# Patient Record
Sex: Female | Born: 1975 | Race: White | Hispanic: No | Marital: Married | Smoking: Former smoker
Health system: Southern US, Community
[De-identification: ages and names within clinical notes are randomized; demographics above are authoritative.]

## PROBLEM LIST (undated history)

## (undated) DIAGNOSIS — Z87891 Personal history of nicotine dependence: Secondary | ICD-10-CM

## (undated) DIAGNOSIS — Z803 Family history of malignant neoplasm of breast: Secondary | ICD-10-CM

## (undated) DIAGNOSIS — D649 Anemia, unspecified: Secondary | ICD-10-CM

## (undated) DIAGNOSIS — F329 Major depressive disorder, single episode, unspecified: Secondary | ICD-10-CM

## (undated) DIAGNOSIS — R519 Headache, unspecified: Secondary | ICD-10-CM

## (undated) DIAGNOSIS — R51 Headache: Secondary | ICD-10-CM

## (undated) DIAGNOSIS — G8929 Other chronic pain: Secondary | ICD-10-CM

## (undated) DIAGNOSIS — F32A Depression, unspecified: Secondary | ICD-10-CM

## (undated) DIAGNOSIS — F419 Anxiety disorder, unspecified: Secondary | ICD-10-CM

## (undated) HISTORY — DX: Major depressive disorder, single episode, unspecified: F32.9

## (undated) HISTORY — DX: Headache, unspecified: R51.9

## (undated) HISTORY — DX: Anemia, unspecified: D64.9

## (undated) HISTORY — DX: Headache: R51

## (undated) HISTORY — PX: TUBAL LIGATION: SHX77

## (undated) HISTORY — DX: Depression, unspecified: F32.A

## (undated) HISTORY — DX: Other chronic pain: G89.29

## (undated) HISTORY — DX: Family history of malignant neoplasm of breast: Z80.3

## (undated) HISTORY — DX: Anxiety disorder, unspecified: F41.9

## (undated) HISTORY — DX: Personal history of nicotine dependence: Z87.891

## (undated) HISTORY — PX: ABDOMINAL HYSTERECTOMY: SHX81

---

## 2004-01-30 HISTORY — PX: VAGINAL HYSTERECTOMY: SHX2639

## 2004-11-30 ENCOUNTER — Observation Stay (HOSPITAL_COMMUNITY): Admission: RE | Admit: 2004-11-30 | Discharge: 2004-12-01 | Payer: Self-pay | Admitting: Gynecology

## 2006-01-29 HISTORY — PX: BREAST BIOPSY: SHX20

## 2007-03-14 ENCOUNTER — Ambulatory Visit: Payer: Self-pay | Admitting: Family Medicine

## 2007-08-26 ENCOUNTER — Ambulatory Visit: Payer: Self-pay | Admitting: Family Medicine

## 2007-08-28 ENCOUNTER — Ambulatory Visit: Payer: Self-pay | Admitting: Family Medicine

## 2008-01-30 HISTORY — PX: BREAST EXCISIONAL BIOPSY: SUR124

## 2008-12-08 ENCOUNTER — Ambulatory Visit: Payer: Self-pay | Admitting: Family Medicine

## 2009-06-06 ENCOUNTER — Ambulatory Visit: Payer: Self-pay | Admitting: Family Medicine

## 2010-01-29 HISTORY — PX: GALLBLADDER SURGERY: SHX652

## 2010-01-29 HISTORY — PX: GASTRIC BYPASS: SHX52

## 2010-02-27 ENCOUNTER — Ambulatory Visit: Payer: Self-pay | Admitting: Family Medicine

## 2010-05-24 ENCOUNTER — Ambulatory Visit: Payer: Self-pay | Admitting: Specialist

## 2010-05-29 ENCOUNTER — Ambulatory Visit: Payer: Self-pay | Admitting: Specialist

## 2010-05-31 ENCOUNTER — Ambulatory Visit: Payer: Self-pay | Admitting: Specialist

## 2010-06-13 ENCOUNTER — Ambulatory Visit: Payer: Self-pay | Admitting: Specialist

## 2010-06-22 ENCOUNTER — Ambulatory Visit: Payer: Self-pay | Admitting: Gastroenterology

## 2010-06-30 ENCOUNTER — Ambulatory Visit: Payer: Self-pay | Admitting: Specialist

## 2010-07-06 ENCOUNTER — Ambulatory Visit: Payer: Self-pay | Admitting: Specialist

## 2010-07-11 ENCOUNTER — Inpatient Hospital Stay: Payer: Self-pay | Admitting: Specialist

## 2010-08-23 ENCOUNTER — Ambulatory Visit: Payer: Self-pay | Admitting: Specialist

## 2010-09-13 ENCOUNTER — Ambulatory Visit: Payer: Self-pay | Admitting: Specialist

## 2010-09-30 ENCOUNTER — Ambulatory Visit: Payer: Self-pay | Admitting: Specialist

## 2011-07-04 ENCOUNTER — Inpatient Hospital Stay: Payer: Self-pay | Admitting: Internal Medicine

## 2011-07-04 LAB — URINALYSIS, COMPLETE
Blood: NEGATIVE
Glucose,UR: NEGATIVE mg/dL (ref 0–75)
Ketone: NEGATIVE
Nitrite: NEGATIVE
Ph: 5 (ref 4.5–8.0)
Protein: NEGATIVE
Squamous Epithelial: 15
WBC UR: 3 /HPF (ref 0–5)

## 2011-07-04 LAB — COMPREHENSIVE METABOLIC PANEL
Albumin: 3.6 g/dL (ref 3.4–5.0)
Alkaline Phosphatase: 72 U/L (ref 50–136)
Anion Gap: 6 — ABNORMAL LOW (ref 7–16)
BUN: 8 mg/dL (ref 7–18)
Bilirubin,Total: 0.7 mg/dL (ref 0.2–1.0)
Calcium, Total: 8.8 mg/dL (ref 8.5–10.1)
Co2: 28 mmol/L (ref 21–32)
EGFR (African American): >60
EGFR (Non-African Amer.): >60
Glucose: 91 mg/dL (ref 65–99)
SGOT(AST): 13 U/L — ABNORMAL LOW (ref 15–37)
SGPT (ALT): 16 U/L
Total Protein: 6.7 g/dL (ref 6.4–8.2)

## 2011-07-04 LAB — CBC
HGB: 15.9 g/dL (ref 12.0–16.0)
MCH: 30.5 pg (ref 26.0–34.0)
MCV: 90 fL (ref 80–100)
RBC: 5.21 10*6/uL — ABNORMAL HIGH (ref 3.80–5.20)
RDW: 13 % (ref 11.5–14.5)

## 2011-07-05 LAB — CBC WITH DIFFERENTIAL/PLATELET
Basophil %: 0.7 %
Eosinophil #: 0.2 10*3/uL (ref 0.0–0.7)
Eosinophil %: 2.6 %
Lymphocyte #: 3.7 10*3/uL — ABNORMAL HIGH (ref 1.0–3.6)
Lymphocyte %: 52.6 %
MCH: 30.1 pg (ref 26.0–34.0)
MCHC: 33.2 g/dL (ref 32.0–36.0)
MCV: 91 fL (ref 80–100)
Neutrophil %: 38.4 %
Platelet: 137 10*3/uL — ABNORMAL LOW (ref 150–440)
RBC: 4.7 10*6/uL (ref 3.80–5.20)
RDW: 13.4 % (ref 11.5–14.5)
WBC: 7 10*3/uL (ref 3.6–11.0)

## 2011-07-05 LAB — BASIC METABOLIC PANEL
Anion Gap: 7 (ref 7–16)
Calcium, Total: 8.3 mg/dL — ABNORMAL LOW (ref 8.5–10.1)
Creatinine: 0.58 mg/dL — ABNORMAL LOW (ref 0.60–1.30)
EGFR (African American): >60
EGFR (Non-African Amer.): >60
Sodium: 142 mmol/L (ref 136–145)

## 2011-07-06 LAB — KOH PREP

## 2011-07-25 ENCOUNTER — Emergency Department: Payer: Self-pay | Admitting: *Deleted

## 2011-07-25 LAB — URINALYSIS, COMPLETE
Bilirubin,UR: NEGATIVE
Glucose,UR: NEGATIVE mg/dL (ref 0–75)
Nitrite: NEGATIVE
RBC,UR: 1 /HPF (ref 0–5)
Specific Gravity: 1.026 (ref 1.003–1.030)
Squamous Epithelial: 8

## 2011-07-25 LAB — LIPASE, BLOOD: Lipase: 113 U/L (ref 73–393)

## 2011-07-25 LAB — COMPREHENSIVE METABOLIC PANEL
Albumin: 3.8 g/dL (ref 3.4–5.0)
Calcium, Total: 9.1 mg/dL (ref 8.5–10.1)
Co2: 29 mmol/L (ref 21–32)
Creatinine: 0.87 mg/dL (ref 0.60–1.30)
EGFR (Non-African Amer.): >60
Glucose: 109 mg/dL — ABNORMAL HIGH (ref 65–99)
SGOT(AST): 17 U/L (ref 15–37)
SGPT (ALT): 16 U/L

## 2011-07-25 LAB — CBC
HCT: 47.8 % — ABNORMAL HIGH (ref 35.0–47.0)
HGB: 16 g/dL (ref 12.0–16.0)
MCH: 29.9 pg (ref 26.0–34.0)
MCV: 89 fL (ref 80–100)
Platelet: 185 10*3/uL (ref 150–440)
WBC: 6.4 10*3/uL (ref 3.6–11.0)

## 2012-04-01 ENCOUNTER — Emergency Department: Payer: Self-pay | Admitting: Emergency Medicine

## 2012-04-01 LAB — COMPREHENSIVE METABOLIC PANEL
Albumin: 3.4 g/dL (ref 3.4–5.0)
Alkaline Phosphatase: 72 U/L (ref 50–136)
Anion Gap: 4 — ABNORMAL LOW (ref 7–16)
BUN: 8 mg/dL (ref 7–18)
Bilirubin,Total: 0.4 mg/dL (ref 0.2–1.0)
Chloride: 108 mmol/L — ABNORMAL HIGH (ref 98–107)
Creatinine: 0.64 mg/dL (ref 0.60–1.30)
EGFR (African American): >60
EGFR (Non-African Amer.): >60
Potassium: 4.1 mmol/L (ref 3.5–5.1)
SGOT(AST): 19 U/L (ref 15–37)
SGPT (ALT): 23 U/L (ref 12–78)
Sodium: 139 mmol/L (ref 136–145)
Total Protein: 6.2 g/dL — ABNORMAL LOW (ref 6.4–8.2)

## 2012-04-01 LAB — URINALYSIS, COMPLETE
Bilirubin,UR: NEGATIVE
Blood: NEGATIVE
Leukocyte Esterase: NEGATIVE
Nitrite: NEGATIVE
Ph: 5 (ref 4.5–8.0)
Specific Gravity: 1.021 (ref 1.003–1.030)
Squamous Epithelial: 4

## 2012-04-01 LAB — CBC
HGB: 14.2 g/dL (ref 12.0–16.0)
MCH: 31.1 pg (ref 26.0–34.0)
MCHC: 34.1 g/dL (ref 32.0–36.0)
Platelet: 164 10*3/uL (ref 150–440)
WBC: 7.8 10*3/uL (ref 3.6–11.0)

## 2012-07-23 ENCOUNTER — Emergency Department: Payer: Self-pay

## 2012-07-23 LAB — URINALYSIS, COMPLETE
Bilirubin,UR: NEGATIVE
Blood: NEGATIVE
Nitrite: NEGATIVE
Ph: 5 (ref 4.5–8.0)
Protein: NEGATIVE
Specific Gravity: 1.025 (ref 1.003–1.030)
Squamous Epithelial: 3

## 2012-07-23 LAB — COMPREHENSIVE METABOLIC PANEL
Albumin: 3.6 g/dL (ref 3.4–5.0)
Alkaline Phosphatase: 96 U/L (ref 50–136)
Chloride: 111 mmol/L — ABNORMAL HIGH (ref 98–107)
Co2: 28 mmol/L (ref 21–32)
Creatinine: 0.7 mg/dL (ref 0.60–1.30)
EGFR (African American): >60
EGFR (Non-African Amer.): >60
Osmolality: 283 (ref 275–301)
Sodium: 142 mmol/L (ref 136–145)
Total Protein: 6.6 g/dL (ref 6.4–8.2)

## 2012-07-23 LAB — CBC
HCT: 41.5 % (ref 35.0–47.0)
MCV: 89 fL (ref 80–100)
RDW: 12.9 % (ref 11.5–14.5)
WBC: 7.5 10*3/uL (ref 3.6–11.0)

## 2012-07-23 LAB — LIPASE, BLOOD: Lipase: 161 U/L (ref 73–393)

## 2012-09-12 ENCOUNTER — Emergency Department: Payer: Self-pay | Admitting: Emergency Medicine

## 2012-09-12 LAB — COMPREHENSIVE METABOLIC PANEL
Albumin: 3.6 g/dL (ref 3.4–5.0)
Alkaline Phosphatase: 74 U/L (ref 50–136)
BUN: 11 mg/dL (ref 7–18)
Co2: 28 mmol/L (ref 21–32)
Creatinine: 0.68 mg/dL (ref 0.60–1.30)
EGFR (Non-African Amer.): >60
Glucose: 69 mg/dL (ref 65–99)
Osmolality: 275 (ref 275–301)
SGOT(AST): 15 U/L (ref 15–37)

## 2012-09-12 LAB — URINALYSIS, COMPLETE
Bilirubin,UR: NEGATIVE
Blood: NEGATIVE
Ketone: NEGATIVE
Ph: 5 (ref 4.5–8.0)
Protein: NEGATIVE
Squamous Epithelial: 1
WBC UR: 3 /HPF (ref 0–5)

## 2012-09-12 LAB — CBC
HGB: 14.4 g/dL (ref 12.0–16.0)
MCH: 30.8 pg (ref 26.0–34.0)
MCHC: 34.4 g/dL (ref 32.0–36.0)
MCV: 90 fL (ref 80–100)
Platelet: 171 10*3/uL (ref 150–440)
RBC: 4.68 10*6/uL (ref 3.80–5.20)
RDW: 13.4 % (ref 11.5–14.5)

## 2012-09-15 ENCOUNTER — Emergency Department: Payer: Self-pay | Admitting: Emergency Medicine

## 2012-09-15 LAB — COMPREHENSIVE METABOLIC PANEL
Alkaline Phosphatase: 72 U/L (ref 50–136)
Bilirubin,Total: 0.3 mg/dL (ref 0.2–1.0)
Calcium, Total: 8.6 mg/dL (ref 8.5–10.1)
Creatinine: 0.75 mg/dL (ref 0.60–1.30)
EGFR (Non-African Amer.): >60
Glucose: 103 mg/dL — ABNORMAL HIGH (ref 65–99)
Potassium: 3.9 mmol/L (ref 3.5–5.1)
SGOT(AST): 16 U/L (ref 15–37)
Sodium: 140 mmol/L (ref 136–145)

## 2012-09-15 LAB — CBC
HCT: 41.3 % (ref 35.0–47.0)
HGB: 14.3 g/dL (ref 12.0–16.0)
MCH: 31 pg (ref 26.0–34.0)
MCHC: 34.7 g/dL (ref 32.0–36.0)
MCV: 89 fL (ref 80–100)
RBC: 4.62 10*6/uL (ref 3.80–5.20)
RDW: 13.1 % (ref 11.5–14.5)
WBC: 8.9 10*3/uL (ref 3.6–11.0)

## 2012-09-15 LAB — URINALYSIS, COMPLETE
Bilirubin,UR: NEGATIVE
Blood: NEGATIVE
Hyaline Cast: 2
Nitrite: NEGATIVE
Ph: 5 (ref 4.5–8.0)
Protein: NEGATIVE

## 2012-09-15 LAB — ETHANOL
Ethanol %: <0.003 % (ref 0.000–0.080)
Ethanol: <3 mg/dL

## 2012-09-15 LAB — TSH: Thyroid Stimulating Horm: 0.95 u[IU]/mL

## 2012-09-16 LAB — DRUG SCREEN, URINE
Benzodiazepine, Ur Scrn: NEGATIVE (ref ?–200)
Cannabinoid 50 Ng, Ur ~~LOC~~: POSITIVE (ref ?–50)
Cocaine Metabolite,Ur ~~LOC~~: NEGATIVE (ref ?–300)
MDMA (Ecstasy)Ur Screen: NEGATIVE (ref ?–500)
Methadone, Ur Screen: NEGATIVE (ref ?–300)
Opiate, Ur Screen: NEGATIVE (ref ?–300)
Phencyclidine (PCP) Ur S: NEGATIVE (ref ?–25)

## 2013-02-14 LAB — HM PAP SMEAR: HM PAP: NORMAL

## 2013-08-14 LAB — LIPID PANEL
Cholesterol: 165 mg/dL (ref 0–200)
HDL: 86 mg/dL — AB (ref 35–70)
LDL Cholesterol: 69 mg/dL
TRIGLYCERIDES: 50 mg/dL (ref 40–160)

## 2013-08-19 ENCOUNTER — Ambulatory Visit: Payer: Self-pay | Admitting: Family Medicine

## 2013-08-19 LAB — HM MAMMOGRAPHY: HM Mammogram: NORMAL

## 2014-03-22 ENCOUNTER — Ambulatory Visit: Payer: Self-pay | Admitting: Family Medicine

## 2014-04-06 ENCOUNTER — Emergency Department: Payer: Self-pay | Admitting: Emergency Medicine

## 2014-04-17 ENCOUNTER — Emergency Department: Payer: Self-pay | Admitting: Emergency Medicine

## 2014-05-23 NOTE — Discharge Summary (Signed)
PATIENT NAME:  [name] MR#:  [id number] DATE OF BIRTH:  [birth year]  DATE OF ADMISSION:  07/04/2011 DATE OF DISCHARGE:  07/08/2011  ADMITTING PHYSICIAN: Enid Baas, MD   DISCHARGING PHYSICIAN: Enid Baas, MD   PRIMARY MD: Elizabeth Sauer, MD   CONSULTATION IN THE HOSPITAL: GI consultation by Dr. Marva Panda  DISCHARGE DIAGNOSES:  1. 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  2. Esophageal Candidiasis, which is mild.  3. Colitis.  4. Tobacco use disorder.   DISCHARGE HOME MEDICATIONS:  1. Prevacid 30 mg SoluTabs p.o. b.i.d.  2. Carafate 1 gram p.o. q.i.d. prior to meals.  3. Hydrocodone/acetaminophen suspension 7.5/500 mg in 15 mL, 10 to 15 mL p.o. q.6 hours p.r.n. for pain.  4. Mycelex troche 10 mg, dissolve in water and take 5 times per day for 10 days.  5. Ciprofloxacin 500 mg p.o. b.i.d. until 07/13/2011.  6. Flagyl 500 mg p.o. q.8 hours until 07/13/2011.   DISCHARGE DIET: The patient needs to be on clear liquid diet for the next couple of days followed by full liquid diet for another three days prior to going on bland soft diet. No carbonated beverages. information regarding her diet instructions are being given.   RETURN TO WORK: Two weeks.   ACTIVITY LIMITATIONS: As tolerated.     FOLLOW-UP INSTRUCTIONS:  1. Follow-up with Dr. Marva Panda in 2 to 3 weeks. She might need a repeat EGD in 4 to 6 weeks.  2. Advised smoking cessation.  LABS PRIOR TO DISCHARGE: WBC 7.0, hemoglobin 14.2, hematocrit 42.7, platelet count 137, sodium 142, potassium 3.9, chloride 106, bicarb 29, BUN 6, creatinine 0.58, glucose 71, calcium 8.3.   CT of the abdomen and pelvis with contrast on admission showing no evidence of gastric outlet obstruction or small bowel obstruction or ileus. Distal transverse colon and splenic flexure exhibit mild wall thickening consistent with either colitis or diverticulitis. No urinary tract abnormality or acute hepatobiliary abnormality are  seen.  Urinalysis negative for any infection. ALT 16, AST 13, alkaline phosphatase 72, total bilirubin 0.7, albumin 3.6, lipase 104.   BRIEF HOSPITAL COURSE: [name] is a 39 year old young Caucasian female with past medical history only significant for gastric bypass surgery about a year ago and tobacco use disorder who came in with severe epigastric pain associated with nausea and mild hematemesis.  1. Gastric ulcer. She was initially started on Protonix drip but the possibility of gastric anastomotic ulcer with her nature of the pain and also prior history of gastric bypass surgery. She was seen by Dr. Marva Panda in consultation and had an upper GI endoscopy done on 07/06/2011 which showed mild esophageal Candidiasis at the distal end and also 1 cm ulcer distal to the anastomosis which was not bleeding. The patient was started on Carafate post EGD and Protonix was changed to IV b.i.d. Because of her gastric bypass surgery, she has to either take Prevacid SoluTabs or Protonix granules as PPI for now and also Carafate to help heal the ulcer. She was also having intense pain in the epigastric region which is improving. She was on Dilaudid while in the hospital along with some Benadryl because Dilaudid caused some itching and is being discharged on hydrocodone suspension for pain.  2. Colitis as evidenced on CT of the abdomen. She only has very minimal left lower quadrant tenderness on admission. She was on Cipro and Flagyl in the hospital and can finish up the course for another five days.  3. Esophageal Candidiasis, very mild as  seen on EGD. She should start Mycelex troche after being treated with PPI and Carafate for 10 days. The duration of treatment with the Mycelex will be another 10 days. She should be starting from 07/16/2011.  Her course has been otherwise uneventful in the hospital. She needed help with medications and Case Management is helping with the medication.   DISCHARGE CONDITION:  Stable.   DISCHARGE DISPOSITION: Home.   TIME SPENT ON DISCHARGE: 40 minutes.   ____________________________ Enid Baas, MD rk:drc D: 07/08/2011 12:28:00 ET T: 07/10/2011 08:25:54 ET JOB#: [id number]  cc: Duanne Limerick, MD Christena Deem, MD Enid Baas, MD, <Dictator>  Enid Baas MD ELECTRONICALLY SIGNED 07/13/2011 15:35

## 2014-05-23 NOTE — Consult Note (Signed)
Chief Complaint:   Subjective/Chief Complaint Patient seen and examined, full consult done.  Patietn previously seen under the name of [name].  H/O gastric bypass 06/2010.    Has done well with 150# wt loss .  Developed epigastric burning, nausea and "dry heaves" two nights ago, progressive increase in the interval, increased with eating.  Pink tinged watery emesis, last time yesterday.  Not taking anti acid meds at home but occasional use of nsaids.   EGD 06/22/2010 for pre-op showint grade b erosive esophagitis and mild h.pylori negative gastritis.   stable currently.  Will plan EGD for tomorrow,  I have discussed the risks benefits and complications of egd to include not limited to bleeding infection perforation and sedation and she wishes to proceed.  Continue ppi, serial hgb as indicated.   Electronic Signatures: Barnetta Chapel (MD)  (Signed 06-Jun-13 14:58)  Authored: Chief Complaint   Last Updated: 06-Jun-13 14:58 by Barnetta Chapel (MD)

## 2014-05-23 NOTE — Consult Note (Signed)
Pt had EGD with post anastamotic ulcer.  Needs to be repeated in 8 weeks and if no healing may need repeat surgery.  Pt on PPI and carafate.  Smokes half a pak a day, some alcohol.  Cautioned her on trying to stop both.  Could use carafate pills dissolved in ounce of water when goes home.  Agree with 3-4 days of clear followed by 3-4d of full liq.    Electronic Signatures: Scot Jun (MD)  (Signed on 08-Jun-13 11:53)  Authored  Last Updated: 08-Jun-13 11:53 by Scot Jun (MD)

## 2014-05-23 NOTE — Consult Note (Signed)
Chief Complaint:   Subjective/Chief Complaint doing well, no further emesis, mild epigastric pain, improving, no melena, minimal bm.   VITAL SIGNS/ANCILLARY NOTES: **Vital Signs.:   07-Jun-13 05:33   Vital Signs Type Routine   Temperature Temperature (F) 98   Celsius 36.6   Temperature Source oral   Pulse Pulse 47   Respirations Respirations 18   Systolic BP Systolic BP 503   Diastolic BP (mmHg) Diastolic BP (mmHg) 71   Mean BP 82   BP Source vital sign device   Pulse Ox % Pulse Ox % 95   Pulse Ox Activity Level  At rest   Oxygen Delivery Room Air/ 21 %   Brief Assessment:   Cardiac Regular    Respiratory clear BS    Gastrointestinal details normal Soft  Nondistended  No masses palpable  Bowel sounds normal  No rebound tenderness  mild to moderate tenderness in the epigastrum.   Lab Results:  Routine Chem:  05-Jun-13 10:25    BUN 8   Creatinine (comp) 0.63  06-Jun-13 05:04    Glucose, Serum 71   BUN  6   Creatinine (comp)  0.58   Sodium, Serum 142   Potassium, Serum 3.9   Chloride, Serum 106   CO2, Serum 29   Calcium (Total), Serum  8.3   Anion Gap 7   Osmolality (calc) 279   eGFR (African American) >60   eGFR (Non-African American) >60 (eGFR values <35m/min/1.73 m2 may be an indication of chronic kidney disease (CKD). Calculated eGFR is useful in patients with stable renal function. The eGFR calculation will not be reliable in acutely ill patients when serum creatinine is changing rapidly. It is not useful in  patients on dialysis. The eGFR calculation may not be applicable to patients at the low and high extremes of body sizes, pregnant women, and vegetarians.)  Routine Hem:  05-Jun-13 10:25    Hemoglobin (CBC) 15.9  06-Jun-13 05:04    WBC (CBC) 7.0   RBC (CBC) 4.70   Hemoglobin (CBC) 14.2   Hematocrit (CBC) 42.7   Platelet Count (CBC)  137   MCV 91   MCH 30.1   MCHC 33.2   RDW 13.4   Neutrophil % 38.4   Lymphocyte % 52.6   Monocyte % 5.7    Eosinophil % 2.6   Basophil % 0.7   Neutrophil # 2.7   Lymphocyte #  3.7   Monocyte # 0.4   Eosinophil # 0.2   Basophil # 0.0 (Result(s) reported on 05 Jul 2011 at 06:11AM.)   Assessment/Plan:  Assessment/Plan:   Assessment 1) hematemesis in the setting of h/o gastric bypass.  stable overnight, no evidence of recurrent bleeding. on ppi.    Plan 1) EGD today, I have discussed the risks benefits and complicationso f egd to include not limited to bleeding infection perforation and sedation and she wishes to proceed.  further recs to follow   Electronic Signatures: SLoistine Simas(MD)  (Signed 07-Jun-13 13:42)  Authored: Chief Complaint, VITAL SIGNS/ANCILLARY NOTES, Brief Assessment, Lab Results, Assessment/Plan   Last Updated: 07-Jun-13 13:42 by SLoistine Simas(MD)

## 2014-05-23 NOTE — Consult Note (Signed)
PATIENT NAMEBULAH, [name] MR#:  [id number] DATE OF BIRTH:  [birth year]  DATE OF CONSULTATION:  07/05/2011  REFERRING PHYSICIAN:   CONSULTING PHYSICIAN:  Keturah Barre, NP  PRIMARY CARE PHYSICIAN: Dr. 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 She was admitted for epigastric pain, nausea, vomiting and GI has been consulted at the request of Dr. Nemiah Commander to evaluate the same. Patient states that she had an onset of epigastric pain on Tuesday and that has increased since then associated with persistent nausea and vomiting x3 yesterday. States emesis was clear with a pink tinge. States she has not had any red foods. States pain increases any p.o. food intake. Took Advil Tuesday to help. Reports that she takes maybe 1 to 2 Advil q.2 weeks but not regularly and no other NSAIDs. Does smoke some tobacco. Denies sick exposures, dietary changes and overeating. Says she follows a high protein low carbohydrate diet. States she has never felt like this before. Reports a 5 pound weight loss over the last week. Denies fever, constitutional symptoms, problems swallowing, acid reflux, heartburn, indigestion, black tarry stools, coffee-ground emesis, bloating, changes to her bowel habits. States she normally has a formed stool every two days but also does report monthly recurrences of diarrhea with occasional night awakening that resolves spontaneously. Does report having EGD by Dr. Marva Panda prior to bariatric surgery. I cannot find this record via [facility] or the [facility] systems. No history of colonoscopy. Hemoglobin stable. Heme negative today. Is on Protonix drip. Did have contrasted CT of abdomen and pelvis with no evidence of gastric obstruction, but did reveal some inflammation to the transverse colon and diverticula. Three-way abdomen was unremarkable.   PAST  MEDICAL HISTORY:  1. Ovarian cyst. 2. Endometriosis. 3. Hysterectomy 2007. 4. Gastric bypass surgery 2012. 5. Cholecystectomy.   ALLERGIES: Morphine.  MEDICATIONS: No regular medications. Does take p.r.n. Advil as noted above.   SOCIAL HISTORY: Lives with parents. Smokes half pack a day. Rare alcohol. No illicits. Works in an office. Patient denies Accutane use as teenager.    FAMILY HISTORY: Dad with hypertension. No colorectal cancer, colon polyps, inflammatory bowel disease, peptic ulcer disease, liver disease. There is family history of breast cancer and hypothyroidism.    REVIEW OF SYSTEMS: CONSTITUTIONAL: No fevers, fatigue, weakness. EYES, EARS, NOSE, THROAT, HEAD: No complaints of headaches, vision changes, ear pain, tinnitus, epistaxis or sinus congestion. RESPIRATORY: Denies shortness of breath, cough, wheeze. CARDIOVASCULAR: Denies chest pain, orthopnea, syncope, edema. GASTROINTESTINAL: As noted. GENITOURINARY: Denies dysuria, hematuria, incontinence. ENDOCRINE: No history of diabetes or thyroid problems, hot or cold intolerance. HEMATOLOGY: No history of easy bruising, anemia or bleeding. SKIN: No erythema, lesion, or rash. MUSCULOSKELETAL: No unusual muscle or joint pain. NEUROLOGIC: No history of numbness, tingling, fainting, dizziness, seizures, stroke. PSYCHOLOGICAL: No history of anxiety, insomnia, or depression.   LABORATORY, DIAGNOSTIC AND RADIOLOGICAL DATA: Most recent lab work: Glucose 71, BUN 6, creatinine 0.58, sodium 142, potassium 3.9, chloride 106, CO2 29. GFR greater than 60, calcium 8.3, lipase 104. Normal hepatic panel with the exception of AST being mildly decreased at 13. WBC 7, hemoglobin 14.2, hematocrit 42.7, platelets 137, red cells are normocytic with normal RDW, however, there was a mild increase in her lymphocyte count at 3.7. Contrasted CT of abdomen/pelvis did not reveal obstruction. There was a question of mild ileus. Did reveal inflamed transverse colon and  diverticula. Liver was normal. Spleen was normal and the gallbladder was normal. Three-way abdomen was unremarkable per report.   PHYSICAL EXAMINATION:  VITAL SIGNS: Most recent vital signs: Temperature 97.6, pulse 69, respiratory rate 20, blood pressure 125/64, oxygen saturation 93%.   GENERAL: Well nourished female lying in bed in no acute distress, appears comfortable.   HEENT: Normocephalic, atraumatic. Anicteric sclerae. No erythema, redness or drainage to the eyes or the nares. Oral mucous membranes are pink and moist.   NECK: Supple. No thyromegaly, JVD or lymphadenopathy.   RESPIRATORY: Respirations eupneic. Lungs CTAB.   CARDIOVASCULAR: S1, S2. Regular rate and rhythm. No MRG. Peripheral pulses 2+. No edema.   ABDOMEN: Nondistended. Bowel sounds x4. Tenderness to the epigastrium. No guarding. No rigidity. Abdomen is soft. No peritoneal signs including rebound tenderness. Also no hepatosplenomegaly, hernias or masses.   RECTAL: Few external hemorrhoids, nonirritated. Stool brown. Heme negative.   EXTREMITIES: MAEW x4. Strength 5/5. No clubbing or cyanosis.   SKIN: No erythema, lesion or rash.   GENITOURINARY: Deferred.   NEUROLOGICAL: Cranial nerves II through XII grossly intact. Alert and oriented x3. Speech clear. No facial droop.   PSYCHOLOGICAL: Pleasant, cooperative, logical train of thought.   IMPRESSION AND PLAN:  1. Epigastric pain with nausea and vomiting. Stable hemoglobin. Heme negative stool. Did note mild elevation in lymphocyte count. May have a viral component, however, with history of bypass and some repeated vomiting cannot rule out gastritis or anastomotic irritation or PUD. Would like prior EGD report if we can find it and records from Dr. Smitty Cords. We will plan on performing an EGD to assess her mucosa. Agree with clear liquid diet, PPI therapy, antiemetics. 2. Inflammation to colon with history of current diarrhea, is currently on antibiotics, has no lower  abdominal symptoms at present. Further recommendations to follow.   These services were provided by Vevelyn Pat, MSN, NPC in collaboration with Christena Deem, M.D. I have discussed this patient with him.   ____________________________ Keturah Barre, NP chl:cms D: 07/05/2011 14:57:55 ET T: 07/05/2011 15:11:55 ET  JOB#: [id number] cc: Keturah Barre, NP, <Dictator> Eustaquio Maize Jerriann Schrom FNP ELECTRONICALLY SIGNED 07/10/2011 8:22

## 2014-05-23 NOTE — Consult Note (Signed)
Brief Consult Note: Diagnosis: Abdominal pain and nausea.   Patient was seen by consultant.   Consult note dictated.   Comments: Appreciate consult for 39y/o caucasian woman with history of endometriosis, hysterectomy, and rou en y bypass by Dr Smitty Cords (2012), for epigastric pain, nausea, and vomiting. Reports onset of epigsatric pain on Tuesday, states it has increased over the last 2d. Associated with persistent nausea, and vomiting x 3 yesterday. Emesis was clear with pink tinge, states she did not have any red foods.  States pain increases with any po intake. Took Advil Tuesday to help- reports that she takes maybe 1-2 Advil q2wk, but not regularly, and no other NSAIDs. Does smoke tobacco. Denies sick exposures, dietary changes, overeating. Says she follows a high protein/low cho diet. States she has never felt like this before. Reports a 5lb weight loss over the last week.  Deneis fevers, constitutional symptoms, problems swallowing, acid reflux, heartburn, indigestion, black tarry stools, coffeeground emesis, bloating changes to her bowel habits- reports normally having a formed stool q2d- but does also report monthly occurences of diarrhea, with occasional night wakenings, that resolve spontaneously.  Did have EGD by Dr Marva Panda prior to bariatric surgery, I cannot find this record. no history of colonoscopy. Hgb stable, heme negative today. On Pantoprazole gtt. Contrasted Ct with no evidence of obstruction, but did reveal some inflammation to the transverse colon, diverticula. 3 way abdomen was unremarkable. Impression and plan: 1. Epigastric pain, NV: with stable hgb, heme negative,  mild elevation in lymph count, may be viral. However, with history of bypass and some repeated vomiting, cannot rule out gastritis/ anastomotic irritation. Would like prior EGD report, records from Dr Smitty Cords. May require EGD. Agree with CLIQ, PPI therapy.                                2. Inflammation to colon: with  history of recurrent diarrhea- on antibiotics, has no lower abdominal sx at present- further rec's to follow.  Electronic Signatures: Vevelyn Pat H (NP)  (Signed 06-Jun-13 10:11)  Authored: Brief Consult Note   Last Updated: 06-Jun-13 10:11 by Keturah Barre (NP)

## 2014-05-23 NOTE — Consult Note (Signed)
Chief Complaint:   Subjective/Chief Complaint pleae see EGD report.  medium to large post anastomotic ulcer in the setting of rou-en-y gastric bypass.  no active bleeding.  The efficacy of a ppi is questional due to the small gastric pouch remnant, but will continue changing drip to iv bid.  When starting on po would use either protonix granules or prevacid 30 solutabs, both used bid. I have added carafate suspension qid.  Also noted was a mild espohageal candidiasis.  Once patient has been on above regimen for about 10 days would start the mycelex troches.    continue clears for 3-4 days then advance to full liquids for 3-4 days then low residue.  Will need a repeat egd in about 2 months,  GI o/p follow up in about 2 weeks.   I will not be available over the weekend.  Dr Mechele Collin is on GI call if needed.   Electronic Signatures: Barnetta Chapel (MD)  (Signed 07-Jun-13 17:11)  Authored: Chief Complaint   Last Updated: 07-Jun-13 17:11 by Barnetta Chapel (MD)

## 2014-05-23 NOTE — H&P (Signed)
PATIENT NAMEMARGRIT, [name] MR#:  [id number] DATE OF BIRTH:  [birth year]  DATE OF ADMISSION:  07/04/2011  ADMITTING PHYSICIAN: Enid Baas, MD  PRIMARY CARE PHYSICIAN: Elizabeth Sauer, MD   CHIEF COMPLAINT: Abdominal pain and nausea.   HISTORY OF PRESENT ILLNESS: [name] is a 39 year old pleasant Caucasian female with no significant past medical history other than gastric bypass surgery done last year, comes to the hospital complaining of a two-day history of severe epigastric pain and also nausea with dry heaves and one episode of mild hematemesis. The patient said she was in her normal state of health until yesterday morning when she started to have dull ache in her epigastric region. She said she never had an upper GI endoscopy done after her bypass surgery and never had this kind of pain before. No sick contacts. She did not eat anything outside. No recent travel. The pain got worse by late yesterday, and this morning she had severe epigastric pain associated with nausea, dry heaves, and also spitting up streaks of blood with clear sputum. No fever or chills. No chest pain. No melena, diarrhea, or other complaints. CT of the abdomen and pelvis done here in the ED showed some mild colitis and diverticulitis at the end of the sigmoid colon, but her pain is still localized in the epigastric region.   PAST MEDICAL HISTORY:  1. Known history of ovarian cyst.  2. Endometriosis.   PAST SURGICAL HISTORY:  1. Gastric bypass surgery.  2. Hysterectomy for endometriosis.  3. Cholecystectomy.   ALLERGIES: Morphine causes itching.   MEDICATIONS: None, but she did take Advil at home for pain yesterday.   SOCIAL HISTORY: She lives at home with parents, smokes about 1/2 pack per day. Very occasional alcohol use.  FAMILY HISTORY: Parents are healthy. Dad with hypertension, otherwise not significant.   REVIEW OF SYSTEMS: CONSTITUTIONAL: No fever, fatigue, or weakness. EYES: No blurred vision,  double vision, pain, inflammation, glaucoma or cataracts. ENT: No tinnitus, ear pain, epistaxis or discharge. RESPIRATORY: No cough, wheeze, hemoptysis, or chronic obstructive pulmonary disease. CARDIOVASCULAR: No chest pain, orthopnea, arrhythmia, premature atrial contractions, palpitations or syncope. GI: Positive for nausea and vomiting. No diarrhea. Positive for epigastric pain. No hematemesis or melena. GENITOURINARY: No dysuria, hematuria, renal calculus, frequency, or incontinence. ENDOCRINE: No polyuria, nocturia, thyroid problems, heat or cold intolerance. HEMATOLOGY: No anemia, easy bruising or bleeding. SKIN: No acne, rash, or lesions. MUSCULOSKELETAL: No neck, back, shoulder pain, arthritis, or gout. NEUROLOGIC: No numbness, weakness, cerebrovascular accident, transient ischemic attack, or seizures. PSYCHOLOGICAL: No anxiety, insomnia, or depression.   PHYSICAL EXAMINATION:  VITAL SIGNS: Temperature 96.6 degrees Fahrenheit, pulse 69, respirations 16, blood pressure 136/89, and pulse oximetry 97% on room air.   GENERAL: A well-built, well-nourished  female sitting in bed, not in any acute distress.   HEENT: Normocephalic, atraumatic. Pupils are equal, round, reacting to light. Anicteric sclerae. Extraocular movements are intact. Oropharynx clear without erythema, mass, or exudates.   NECK: Supple. No thyromegaly, JVD, or carotid bruits. No lymphadenopathy.   LUNGS: Clear to auscultation bilaterally. No wheeze or crackles. No use of accessory muscles for breathing.   CARDIOVASCULAR: S1, S2 regular rate and rhythm. No murmurs, rubs, or gallops.   ABDOMEN: Tenderness in the epigastric region. No guarding or rigidity. Normal bowel sounds.   EXTREMITIES: No pedal edema. No clubbing or cyanosis. Dorsalis pedis pulses 2+ and palpable bilaterally.   SKIN: No acne, rash, or lesions.   LYMPH: No cervical lymphadenopathy.   NEUROLOGICAL:  Cranial nerves are intact. No focal motor or sensory  deficits.   PSYCHOLOGICAL: The patient is awake, alert, oriented x3.   LABORATORY, DIAGNOSTIC AND RADIOLOGICAL DATA:  WBC 6.6, hemoglobin 15.9, hematocrit 46.9, platelet count 143.  Sodium 142, potassium 3.9, chloride 108, bicarbonate 28, BUN 8, creatinine 0.63, glucose 91, and calcium 8.8.  ALT 16, AST 13, alkaline phosphatase 72, total bilirubin 0.7, and albumin of 3.6, lipase 104.  Urinalysis was negative for any infection.  Abdominal x-ray showed unremarkable abdominal series.  CT of the abdomen and pelvis showed no gastric outlet obstruction or small bowel obstruction, though mild ileus of small bowel cannot be excluded. Distal transverse colon and splenic flexure exhibit mild wall thickening, could reflect colitis or diverticulosis. No other discrete mass, abscess or perforation seen. No urinary tract abnormality or hepatobiliary abnormality. Cystic ovarian process in the left adnexal region.   ASSESSMENT AND PLAN: The patient is a 39 year old female with a history of gastric bypass done last year, no other medical problems, comes with severe epigastric pain, nausea and vomiting. CT of the abdomen is showing mild colitis in the lower abdomen.   1. Epigastric pain with nausea and vomiting: Could be viral gastritis versus anastomotic ulcer from her prior gastric bypass surgery. We will start her on IV Protonix drip, get GI consult. No previous EGD was done. IV fluids, Zofran, Phenergan and Dilaudid for pain as the patient is allergic to morphine.  2. Colitis and diverticulitis: Not very symptomatic with that, but we will start Cipro and Flagyl at this time, change to oral for 7 days at the time of discharge.  3. Tobacco use disorder: Counseled for three minutes and started on a nicotine patch.   CODE STATUS:  FULL CODE.    TIME SPENT ON ADMISSION: 50 minutes.  ____________________________ Enid Baas, MD rk:cbb D: 07/04/2011 17:19:46 ET T: 07/04/2011 18:34:28  ET JOB#: [id number] cc: Duanne Limerick, MD  Lutricia Feil, MD  Enid Baas MD ELECTRONICALLY SIGNED 07/08/2011 19:07

## 2014-06-23 DIAGNOSIS — E7849 Other hyperlipidemia: Secondary | ICD-10-CM | POA: Insufficient documentation

## 2014-06-23 DIAGNOSIS — Z0000 Encounter for general adult medical examination without abnormal findings: Secondary | ICD-10-CM | POA: Insufficient documentation

## 2014-06-23 DIAGNOSIS — E119 Type 2 diabetes mellitus without complications: Secondary | ICD-10-CM | POA: Insufficient documentation

## 2014-06-23 DIAGNOSIS — F329 Major depressive disorder, single episode, unspecified: Secondary | ICD-10-CM | POA: Insufficient documentation

## 2014-10-08 LAB — HM HEPATITIS C SCREENING LAB: HM Hepatitis Screen: NEGATIVE

## 2014-10-08 LAB — HM HIV SCREENING LAB: HM HIV Screening: NEGATIVE

## 2015-01-30 HISTORY — PX: OTHER SURGICAL HISTORY: SHX169

## 2015-07-08 ENCOUNTER — Other Ambulatory Visit: Payer: Self-pay | Admitting: Family Medicine

## 2015-07-14 ENCOUNTER — Encounter: Payer: Self-pay | Admitting: Family Medicine

## 2015-07-14 ENCOUNTER — Ambulatory Visit (INDEPENDENT_AMBULATORY_CARE_PROVIDER_SITE_OTHER): Payer: BLUE CROSS/BLUE SHIELD | Admitting: Family Medicine

## 2015-07-14 VITALS — BP 120/70 | HR 64 | Ht 66.0 in | Wt 187.0 lb

## 2015-07-14 DIAGNOSIS — N63 Unspecified lump in breast: Secondary | ICD-10-CM

## 2015-07-14 DIAGNOSIS — L03818 Cellulitis of other sites: Secondary | ICD-10-CM | POA: Diagnosis not present

## 2015-07-14 DIAGNOSIS — N6310 Unspecified lump in the right breast, unspecified quadrant: Secondary | ICD-10-CM

## 2015-07-14 MED ORDER — AMOXICILLIN-POT CLAVULANATE 875-125 MG PO TABS: 1.0000 | ORAL_TABLET | Freq: Two times a day (BID) | ORAL | Status: DC

## 2015-07-14 NOTE — Progress Notes (Signed)
Name: [name]   MRN: [id number]    DOB: 09-06-1975   Date:07/14/2015       Progress Note  Subjective  Chief Complaint  Chief Complaint  Patient presents with  . Breast Mass    R) breast approx 9:00- had a lumpectomy in same breast in 2009 or 10    HPI Comments: Patient noted right breast mass 2 weeks ago. Nontender/ no discharge / previous lumpectomy noncancer   No problem-specific assessment & plan notes found for this encounter.   History reviewed. No pertinent past medical history.  Past Surgical History  Procedure Laterality Date  . Gallbladder surgery  2012  . Gastric bypass  2012  . Vaginal hysterectomy  2006  . Tubal ligation    . Cesarean section      History reviewed. No pertinent family history.  Social History   Social History  . Marital Status: Single    Spouse Name: N/A  . Number of Children: N/A  . Years of Education: N/A   Occupational History  . Not on file.   Social History Main Topics  . Smoking status: Current Every Day Smoker  . Smokeless tobacco: Not on file  . Alcohol Use: 0.0 oz/week    0 Standard drinks or equivalent per week  . Drug Use: No  . Sexual Activity: Not on file   Other Topics Concern  . Not on file   Social History Narrative    Allergies  Allergen Reactions  . Morphine Itching     Review of Systems  Constitutional: Negative for fever, chills, weight loss and malaise/fatigue.  HENT: Negative for ear discharge, ear pain and sore throat.   Eyes: Negative for blurred vision.  Respiratory: Negative for cough, sputum production, shortness of breath and wheezing.   Cardiovascular: Negative for chest pain, palpitations and leg swelling.       Breat exam noted  Gastrointestinal: Negative for heartburn, nausea, abdominal pain, diarrhea, constipation, blood in stool and melena.  Genitourinary: Negative for dysuria, urgency, frequency and hematuria.  Musculoskeletal: Negative for myalgias, back pain, joint pain  and neck pain.  Skin: Negative for rash.  Neurological: Negative for dizziness, tingling, sensory change, focal weakness and headaches.  Endo/Heme/Allergies: Negative for environmental allergies and polydipsia. Does not bruise/bleed easily.  Psychiatric/Behavioral: Negative for depression and suicidal ideas. The patient is not nervous/anxious and does not have insomnia.      Objective  Filed Vitals:   07/14/15 0928  BP: 120/70  Pulse: 64  Height: 5\' 6"  (1.676 m)  Weight: 187 lb (84.823 kg)    Physical Exam  Constitutional: She is well-developed, well-nourished, and in no distress. No distress.  HENT:  Head: Normocephalic and atraumatic.  Right Ear: External ear normal.  Left Ear: External ear normal.  Nose: Nose normal.  Mouth/Throat: Oropharynx is clear and moist.  Eyes: Conjunctivae and EOM are normal. Pupils are equal, round, and reactive to light. Right eye exhibits no discharge. Left eye exhibits no discharge.  Neck: Normal range of motion. Neck supple. No JVD present. No thyromegaly present.  Cardiovascular: Normal rate, regular rhythm, S1 normal, S2 normal, normal heart sounds and intact distal pulses.  Exam reveals no gallop and no friction rub.   No murmur heard. Pulmonary/Chest: Effort normal and breath sounds normal. She has no wheezes. She has no rales. Right breast exhibits mass and skin change. Right breast exhibits no inverted nipple, no nipple discharge and no tenderness. Left breast exhibits no inverted nipple, no mass,  no nipple discharge, no skin change and no tenderness. Breasts are symmetrical.    Right nipple/areolar erythema/ 1 cm palpable mass at 8 oclock right  Abdominal: Soft. Bowel sounds are normal. She exhibits no mass. There is no tenderness. There is no guarding.  Musculoskeletal: Normal range of motion. She exhibits no edema.  Lymphadenopathy:    She has no cervical adenopathy.  Neurological: She is alert. She has normal reflexes.  Skin: Skin is  warm and dry. She is not diaphoretic.  Psychiatric: Mood and affect normal.  Nursing note and vitals reviewed.     Assessment & Plan  Problem List Items Addressed This Visit    None    Visit Diagnoses    Breast mass, right    -  Primary    Relevant Orders    MM Digital Diagnostic Bilat    US BREAST LTD UNI RIGHT INC AXILLA    US BREAST LTD UNI LEFT INC AXILLA    Cellulitis of other specified site        right nipple    Relevant Medications    amoxicillin-clavulanate (AUGMENTIN) 875-125 MG tablet         Dr. [facility]  07/14/2015

## 2015-07-25 ENCOUNTER — Other Ambulatory Visit: Payer: Self-pay

## 2015-08-01 ENCOUNTER — Ambulatory Visit
Admission: RE | Admit: 2015-08-01 | Discharge: 2015-08-01 | Disposition: A | Discharge status: Home / Self Care | Payer: BLUE CROSS/BLUE SHIELD | Source: Ambulatory Visit | Attending: Family Medicine | Admitting: Family Medicine

## 2015-08-01 DIAGNOSIS — N6310 Unspecified lump in the right breast, unspecified quadrant: Secondary | ICD-10-CM

## 2015-08-01 DIAGNOSIS — N63 Unspecified lump in breast: Secondary | ICD-10-CM | POA: Insufficient documentation

## 2015-08-03 ENCOUNTER — Other Ambulatory Visit: Payer: Self-pay

## 2016-06-29 DIAGNOSIS — Z87891 Personal history of nicotine dependence: Secondary | ICD-10-CM

## 2016-06-29 HISTORY — DX: Personal history of nicotine dependence: Z87.891

## 2016-07-26 ENCOUNTER — Other Ambulatory Visit: Payer: Self-pay | Admitting: Family Medicine

## 2016-07-26 ENCOUNTER — Encounter: Payer: Self-pay | Admitting: Family Medicine

## 2016-07-26 ENCOUNTER — Ambulatory Visit (INDEPENDENT_AMBULATORY_CARE_PROVIDER_SITE_OTHER): Payer: Self-pay | Admitting: Family Medicine

## 2016-07-26 VITALS — BP 122/74 | HR 64 | Temp 98.6°F | Ht 65.0 in | Wt 180.0 lb

## 2016-07-26 DIAGNOSIS — Z01419 Encounter for gynecological examination (general) (routine) without abnormal findings: Secondary | ICD-10-CM

## 2016-07-26 DIAGNOSIS — Z0000 Encounter for general adult medical examination without abnormal findings: Secondary | ICD-10-CM

## 2016-07-26 DIAGNOSIS — F419 Anxiety disorder, unspecified: Secondary | ICD-10-CM

## 2016-07-26 DIAGNOSIS — F410 Panic disorder [episodic paroxysmal anxiety] without agoraphobia: Secondary | ICD-10-CM | POA: Insufficient documentation

## 2016-07-26 LAB — POCT URINALYSIS DIPSTICK
BILIRUBIN UA: NEGATIVE
Blood, UA: NEGATIVE
GLUCOSE UA: NEGATIVE
KETONES UA: NEGATIVE
LEUKOCYTES UA: NEGATIVE
Nitrite, UA: NEGATIVE
PROTEIN UA: NEGATIVE
SPEC GRAV UA: 1.015 (ref 1.010–1.025)
Urobilinogen, UA: 1 E.U./dL
pH, UA: 5 (ref 5.0–8.0)

## 2016-07-26 LAB — HEMOCCULT GUIAC POC 1CARD (OFFICE)
Card #3 Fecal Occult Blood, POC: NEGATIVE
FECAL OCCULT BLD: NEGATIVE
Fecal Occult Blood, POC: NEGATIVE

## 2016-07-26 MED ORDER — BUSPIRONE HCL 15 MG PO TABS: 15.0000 mg | ORAL_TABLET | Freq: Every morning | ORAL | 5 refills | Status: DC

## 2016-07-26 NOTE — Progress Notes (Signed)
Name: [name]   MRN: [id number]    DOB: 03/31/1975   Date:07/26/2016       Progress Note  Subjective  Chief Complaint  Chief Complaint  Patient presents with  . Annual Exam    Patient presents for annual physical exam.   Anxiety  Presents for initial visit. Onset was 6 to 12 months ago. The problem has been gradually worsening. Symptoms include irritability, nervous/anxious behavior, palpitations, panic and shortness of breath. Patient reports no chest pain, decreased concentration, depressed mood, dizziness, insomnia, nausea or suicidal ideas. The severity of symptoms is moderate.   Her past medical history is significant for anxiety/panic attacks. There is no history of anemia, CAD, depression or hyperthyroidism. Past treatments include SSRIs (welbutrin).    No problem-specific Assessment & Plan notes found for this encounter.   Past Medical History:  Diagnosis Date  . Quit smoking 06/29/2016    Past Surgical History:  Procedure Laterality Date  . ABDOMINAL HYSTERECTOMY    . BREAST BIOPSY Right 2008   CORE - NEG  . BREAST EXCISIONAL BIOPSY Right 2010   NEG  . CESAREAN SECTION    . GALLBLADDER SURGERY  2012  . GASTRIC BYPASS  2012  . TUBAL LIGATION    . VAGINAL HYSTERECTOMY  2006    Family History  Problem Relation Age of Onset  . Breast cancer Mother        62'S  . Breast cancer Maternal Grandmother        60'S    Social History   Social History  . Marital status: Single    Spouse name: N/A  . Number of children: N/A  . Years of education: N/A   Occupational History  . Not on file.   Social History Main Topics  . Smoking status: Current Every Day Smoker  . Smokeless tobacco: Former Neurosurgeon  . Alcohol use 0.0 oz/week  . Drug use: No  . Sexual activity: Not on file   Other Topics Concern  . Not on file   Social History Narrative  . No narrative on file    Allergies  Allergen Reactions  . Morphine Itching    Outpatient  Medications Prior to Visit  Medication Sig Dispense Refill  . amoxicillin-clavulanate (AUGMENTIN) 875-125 MG tablet Take 1 tablet by mouth 2 (two) times daily. 20 tablet 0  . buPROPion (WELLBUTRIN SR) 150 MG 12 hr tablet Take 150 mg by mouth. GYN    . gabapentin (NEURONTIN) 300 MG capsule GYN    . nortriptyline (PAMELOR) 25 MG capsule Take 25 mg by mouth. GYN     No facility-administered medications prior to visit.     Review of Systems  Constitutional: Positive for irritability. Negative for chills, fever, malaise/fatigue and weight loss.  HENT: Negative for ear discharge, ear pain and sore throat.   Eyes: Negative for blurred vision.  Respiratory: Positive for shortness of breath. Negative for cough, sputum production and wheezing.   Cardiovascular: Positive for palpitations. Negative for chest pain and leg swelling.  Gastrointestinal: Negative for abdominal pain, blood in stool, constipation, diarrhea, heartburn, melena and nausea.  Genitourinary: Negative for dysuria, frequency, hematuria and urgency.  Musculoskeletal: Negative for back pain, joint pain, myalgias and neck pain.  Skin: Negative for rash.  Neurological: Negative for dizziness, tingling, sensory change, focal weakness and headaches.  Endo/Heme/Allergies: Negative for environmental allergies and polydipsia. Does not bruise/bleed easily.  Psychiatric/Behavioral: Negative for decreased concentration, depression and suicidal ideas. The patient is nervous/anxious. The patient  does not have insomnia.      Objective  Vitals:   07/26/16 0821  BP: 122/74  Pulse: 64  Temp: 98.6 F (37 C)  SpO2: 99%  Weight: 180 lb (81.6 kg)  Height: 5\' 5"  (1.651 m)    Physical Exam  Constitutional: She is well-developed, well-nourished, and in no distress. No distress.  HENT:  Head: Normocephalic and atraumatic.  Right Ear: External ear normal.  Left Ear: External ear normal.  Nose: Nose normal.  Mouth/Throat: Oropharynx is clear  and moist.  Eyes: Conjunctivae and EOM are normal. Pupils are equal, round, and reactive to light. Right eye exhibits no discharge. Left eye exhibits no discharge.  Neck: Normal range of motion. Neck supple. No JVD present. No thyromegaly present.  Cardiovascular: Normal rate, regular rhythm, normal heart sounds and intact distal pulses.  Exam reveals no gallop and no friction rub.   No murmur heard. Pulmonary/Chest: Effort normal and breath sounds normal. She has no wheezes. She has no rales.  Abdominal: Soft. Bowel sounds are normal. She exhibits no mass. There is no tenderness. There is no guarding.  Genitourinary: Vagina normal, right adnexa normal and left adnexa normal. Rectal exam shows guaiac negative stool.  Musculoskeletal: Normal range of motion. She exhibits no edema.  Lymphadenopathy:    She has no cervical adenopathy.  Neurological: She is alert. She has normal reflexes.  Skin: Skin is warm and dry. She is not diaphoretic.  Psychiatric: Mood and affect normal.  Nursing note and vitals reviewed.     Assessment & Plan  Problem List Items Addressed This Visit      Other   Anxiety   Relevant Medications   busPIRone (BUSPAR) 15 MG tablet    Other Visit Diagnoses    Annual physical exam    -  Primary   Relevant Orders   Pap IG and HPV (high risk) DNA detection   POCT Occult Blood Stool (Completed)   Gynecologic exam normal       Relevant Orders   POCT urinalysis dipstick (Completed)      Meds ordered this encounter  Medications  . busPIRone (BUSPAR) 15 MG tablet    Sig: Take 1 tablet (15 mg total) by mouth every morning.    Dispense:  30 tablet    Refill:  5      Dr. Elizabeth Sauer [facility]  07/26/16

## 2016-07-29 LAB — PAP IG AND HPV HIGH-RISK
HPV, HIGH-RISK: NEGATIVE
PAP SMEAR COMMENT: 0

## 2016-07-30 ENCOUNTER — Other Ambulatory Visit: Payer: Self-pay

## 2016-07-30 DIAGNOSIS — Z1239 Encounter for other screening for malignant neoplasm of breast: Secondary | ICD-10-CM

## 2016-08-10 ENCOUNTER — Ambulatory Visit
Admission: RE | Admit: 2016-08-10 | Discharge: 2016-08-10 | Disposition: A | Discharge status: Home / Self Care | Payer: BLUE CROSS/BLUE SHIELD | Source: Ambulatory Visit | Attending: Family Medicine | Admitting: Family Medicine

## 2016-08-10 DIAGNOSIS — Z1239 Encounter for other screening for malignant neoplasm of breast: Secondary | ICD-10-CM

## 2016-08-10 DIAGNOSIS — Z1231 Encounter for screening mammogram for malignant neoplasm of breast: Secondary | ICD-10-CM | POA: Insufficient documentation

## 2017-04-17 ENCOUNTER — Emergency Department
Admission: EM | Admit: 2017-04-17 | Discharge: 2017-04-17 | Disposition: A | Discharge status: Home / Self Care | Payer: BLUE CROSS/BLUE SHIELD | Attending: Emergency Medicine | Admitting: Emergency Medicine

## 2017-04-17 ENCOUNTER — Encounter: Payer: Self-pay | Admitting: Emergency Medicine

## 2017-04-17 DIAGNOSIS — Z5321 Procedure and treatment not carried out due to patient leaving prior to being seen by health care provider: Secondary | ICD-10-CM | POA: Insufficient documentation

## 2017-04-17 DIAGNOSIS — R109 Unspecified abdominal pain: Secondary | ICD-10-CM | POA: Insufficient documentation

## 2017-04-17 DIAGNOSIS — R350 Frequency of micturition: Secondary | ICD-10-CM | POA: Insufficient documentation

## 2017-04-17 NOTE — ED Notes (Signed)
While attempting to locate pt with family member pt came out of an unassigned room, stating she was leaving. RN attempted to find out how pt had been placed in a room not assigned to pt. Pt and family member cursing and stated they were leaving and walked out.

## 2017-04-17 NOTE — ED Notes (Signed)
Attempted to collect urine sample pt not in room.

## 2017-04-17 NOTE — ED Triage Notes (Signed)
Pt reports started this am with some pain to her back right side. Pt reports the pain started dull but has gotten worse. Pt reports urnary frequency as well.

## 2017-07-30 ENCOUNTER — Encounter: Payer: Self-pay | Admitting: Family Medicine

## 2017-08-06 ENCOUNTER — Encounter: Payer: Self-pay | Admitting: Family Medicine

## 2017-11-06 ENCOUNTER — Telehealth: Payer: Self-pay

## 2017-11-06 NOTE — Telephone Encounter (Signed)
I was transferred a call from front desk Wendie Simmer): Patient requesting paperwork be faxed to [place]. I asked is she meaning Order for [facility] and she said NO Referral is needed. I asked had Jones or Delice Bison asked her to go and do Mammogram and she explained NO and said I have not been seen in over 12 months. I explained we need to see her at least once a year and she said she does not do CPE or any OV due to hysterectomy and doies not need preventative care anymore now. I tried to say we do breast exam and she was told since missing 2 appts from front staff that she needs OV. She got mad and hung up while I was saying why we need to see her to order Mammogram.

## 2017-12-05 ENCOUNTER — Ambulatory Visit (INDEPENDENT_AMBULATORY_CARE_PROVIDER_SITE_OTHER): Payer: PRIVATE HEALTH INSURANCE | Admitting: Nurse Practitioner

## 2017-12-05 ENCOUNTER — Encounter: Payer: Self-pay | Admitting: Nurse Practitioner

## 2017-12-05 VITALS — BP 130/75 | HR 71 | Temp 98.9°F | Ht 65.0 in | Wt 188.0 lb

## 2017-12-05 DIAGNOSIS — R4781 Slurred speech: Secondary | ICD-10-CM | POA: Diagnosis not present

## 2017-12-05 DIAGNOSIS — Z23 Encounter for immunization: Secondary | ICD-10-CM

## 2017-12-05 DIAGNOSIS — Z1231 Encounter for screening mammogram for malignant neoplasm of breast: Secondary | ICD-10-CM

## 2017-12-05 DIAGNOSIS — Z7689 Persons encountering health services in other specified circumstances: Secondary | ICD-10-CM | POA: Diagnosis not present

## 2017-12-05 DIAGNOSIS — R531 Weakness: Secondary | ICD-10-CM

## 2017-12-05 DIAGNOSIS — R202 Paresthesia of skin: Secondary | ICD-10-CM

## 2017-12-05 DIAGNOSIS — R200 Anesthesia of skin: Secondary | ICD-10-CM | POA: Diagnosis not present

## 2017-12-05 DIAGNOSIS — D649 Anemia, unspecified: Secondary | ICD-10-CM

## 2017-12-05 NOTE — Patient Instructions (Addendum)
[name],   Thank you for coming in to clinic today.  1. Your mammogram order has been placed.  Call the Scheduling phone number at [phone] to schedule your mammogram at your convenience.  You can choose to go to either location listed below.  Let the scheduler know which location you prefer.  [facility]  [facility]  [address]  [place], [place] [address]   [facility] [address] [place], [place] [address]  2. You will be due for BLOOD WORK.  This means you can eat no food or on the morning of your lab visit. - Please go ahead and schedule a "Lab Only" visit in the morning at the clinic for lab draw in the next 7 days. - Your results will be available about 2-3 days after blood draw.  If you have set up a MyChart account, you can can log in to MyChart online to view your results and a brief explanation. Also, we can discuss your results together at your next office visit if you would like.   Please schedule a follow-up appointment with Wilhelmina Mcardle, AGNP. Return in about 1 year (around 12/06/2018) for annual physical.  If you have any other questions or concerns, please feel free to call the clinic or send a message through MyChart. You may also schedule an earlier appointment if necessary.  You will receive a survey after today's visit either digitally by e-mail or paper by [employer]. Your experiences and feedback matter to [place].  Please respond so we know how we are doing as we provide care for you.   Wilhelmina Mcardle, DNP, AGNP-BC Adult Gerontology Nurse Practitioner [facility]

## 2017-12-05 NOTE — Progress Notes (Signed)
Subjective:    Patient ID: [name], female    DOB: 02/26/1975, 42 y.o.   MRN: [id number]  [name] is a 42 y.o. female presenting on 12/05/2017 for Establish Care   HPI Establish Care New Provider Pt last seen by PCP Dr. Elizabeth Sauer about 1.5 years ago.  Obtain records from [facility].    Mammogram - Breast Cancer screening concerns as patient has had breast mass in past.   - She requests order for repeat screening mammogram.  Numbness Fingers go numb, feet go numb, drops things all the time.  Occasionally starts stuttering and has mixed up words, mood swings.   - 2 mat aunts, 1st cousin have MS. - Patient notes no other symptoms of weakness except from hands.  Past Medical History:  Diagnosis Date  . Anemia   . Anxiety   . Chronic headaches   . Depression   . Quit smoking 06/29/2016   Past Surgical History:  Procedure Laterality Date  . ABDOMINAL HYSTERECTOMY    . bartholins cyst removal  2017  . BREAST BIOPSY Right 2008   CORE - NEG  . BREAST EXCISIONAL BIOPSY Right 2010   NEG  . CESAREAN SECTION    . GALLBLADDER SURGERY  2012  . GASTRIC BYPASS  2012  . TUBAL LIGATION    . VAGINAL HYSTERECTOMY  2006   Social History   Socioeconomic History  . Marital status: Single    Spouse name: Not on file  . Number of children: Not on file  . Years of education: Not on file  . Highest education level: Not on file  Occupational History  . Not on file  Social Needs  . Financial resource strain: Not on file  . Food insecurity:    Worry: Not on file    Inability: Not on file  . Transportation needs:    Medical: Not on file    Non-medical: Not on file  Tobacco Use  . Smoking status: Former Smoker    Last attempt to quit: 2018    Years since quitting: 1.8  . Smokeless tobacco: Former Engineer, water and Sexual Activity  . Alcohol use: Yes    Alcohol/week: 0.0 standard drinks  . Drug use: No  . Sexual activity: Yes    Birth control/protection:  None  Lifestyle  . Physical activity:    Days per week: 3 days    Minutes per session: 60 min  . Stress: Not on file  Relationships  . Social connections:    Talks on phone: Not on file    Gets together: Not on file    Attends religious service: Not on file    Active member of club or organization: Not on file    Attends meetings of clubs or organizations: Not on file    Relationship status: Not on file  . Intimate partner violence:    Fear of current or ex partner: Not on file    Emotionally abused: Not on file    Physically abused: Not on file    Forced sexual activity: Not on file  Other Topics Concern  . Not on file  Social History Narrative  . Not on file   Family History  Problem Relation Age of Onset  . Breast cancer Mother        25'S  . Breast cancer Maternal Grandmother        60'S  . Hypothyroidism Father    No current outpatient medications on  file prior to visit.   No current facility-administered medications on file prior to visit.     Review of Systems  Constitutional: Negative for chills and fever.  HENT: Negative for congestion and sore throat.   Eyes: Negative for pain.  Respiratory: Negative for cough, shortness of breath and wheezing.   Cardiovascular: Negative for chest pain, palpitations and leg swelling.  Gastrointestinal: Negative for abdominal pain, blood in stool, constipation, diarrhea, nausea and vomiting.  Endocrine: Positive for cold intolerance. Negative for polydipsia.  Genitourinary: Negative for dysuria, frequency, hematuria and urgency.  Musculoskeletal: Negative for back pain, myalgias and neck pain.  Skin: Negative.  Negative for rash.  Allergic/Immunologic: Negative for environmental allergies.  Neurological: Positive for speech difficulty, weakness, numbness and headaches. Negative for dizziness.  Hematological: Does not bruise/bleed easily.  Psychiatric/Behavioral: Negative for dysphoric mood and suicidal ideas. The patient is  not nervous/anxious.    Per HPI unless specifically indicated above     Objective:    BP 130/75 (BP Location: Right Arm, Patient Position: Sitting, Cuff Size: Normal)   Pulse 71   Temp 98.9 F (37.2 C)   Ht 5\' 5"  (1.651 m)   Wt 188 lb (85.3 kg)   LMP  (LMP Unknown)   BMI 31.28 kg/m   Wt Readings from Last 3 Encounters:  12/05/17 188 lb (85.3 kg)  04/17/17 180 lb (81.6 kg)  07/26/16 180 lb (81.6 kg)    Physical Exam  Constitutional: She is oriented to person, place, and time. She appears well-developed and well-nourished. No distress.  HENT:  Head: Normocephalic and atraumatic.  Cardiovascular: Normal rate, regular rhythm, S1 normal, S2 normal, normal heart sounds and intact distal pulses.  Pulmonary/Chest: Effort normal and breath sounds normal. No respiratory distress.    Neurological: She is alert and oriented to person, place, and time. She has normal strength. No cranial nerve deficit or sensory deficit. She displays a negative Romberg sign. Gait normal.  Reflex Scores:      Bicep reflexes are 1+ on the right side and 1+ on the left side.      Patellar reflexes are 0 on the right side and 0 on the left side. Skin: Skin is warm and dry. Capillary refill takes less than 2 seconds.  Psychiatric: She has a normal mood and affect. Her behavior is normal. Judgment and thought content normal.  Vitals reviewed.   Results for orders placed or performed in visit on 07/26/16  Pap IG and HPV (high risk) DNA detection  Result Value Ref Range   DIAGNOSIS: Comment    Specimen adequacy: Comment    Clinician Provided ICD10 Comment    Performed by: Comment    PAP Smear Comment .    Note: Comment    Test Methodology Comment    HPV, high-risk Negative Negative      Assessment & Plan:   Problem List Items Addressed This Visit    None    Visit Diagnoses    Encounter to establish care     Previous PCP was at [facility].  Records are reviewed in CareEverywhere.  Past  medical, family, and surgical history reviewed w/ pt.     Numbness and tingling of left upper and lower extremity    -  Primary Patient with unknown source of numbness and tingling of upper and lower extremities.  Possible differential diagnoses include electrolyte abnormalities, B12 deficiency, anemia, MS, neuropathy.  Plan: 1. Labs today - Normal B12, very low iron/ferritin and significant anemia.  Referral hematology. 2. Considered and offered referral to neurology for patient due to family history of MS.  Patient declined. 3. Follow-up prn after labs.   Relevant Orders   COMPLETE METABOLIC PANEL WITH GFR (Completed)   CBC with Differential/Platelet (Completed)   Magnesium (Completed)   B12 and Folate Panel (Completed)   Weakness     See AP numbness above.   Relevant Orders   COMPLETE METABOLIC PANEL WITH GFR (Completed)   CBC with Differential/Platelet (Completed)   Magnesium (Completed)   B12 and Folate Panel (Completed)   Slurred speech     See AP numbness above.   Relevant Orders   COMPLETE METABOLIC PANEL WITH GFR (Completed)   CBC with Differential/Platelet (Completed)   Flu vaccine need     Pt < age 51.  Needs annual influenza vaccine.  Plan: 1. Administer Quad flu vaccine.    Relevant Orders   Flu Vaccine QUAD 36+ mos IM (Completed)   Breast cancer screening by mammogram     Pt last mammogram 2018.  Patient requests repeat due to prior breast cysts.  Symmetric fibrocystic breast changes noted today.  Plan: 1. Screening mammogram order placed.  Pt will call to schedule appointment.  Information given.   Relevant Orders   MM DIGITAL SCREENING BILATERAL       Follow up plan: Return in about 1 year (around 12/06/2018) for annual physical.  Wilhelmina Mcardle, DNP, AGPCNP-BC Adult Gerontology Primary Care Nurse Practitioner [facility] 12/05/2017, 2:40 PM

## 2017-12-06 ENCOUNTER — Other Ambulatory Visit: Payer: Self-pay | Admitting: Nurse Practitioner

## 2017-12-06 ENCOUNTER — Other Ambulatory Visit: Payer: PRIVATE HEALTH INSURANCE

## 2017-12-06 ENCOUNTER — Telehealth: Payer: Self-pay | Admitting: Nurse Practitioner

## 2017-12-06 DIAGNOSIS — Z1231 Encounter for screening mammogram for malignant neoplasm of breast: Secondary | ICD-10-CM

## 2017-12-06 DIAGNOSIS — Z872 Personal history of diseases of the skin and subcutaneous tissue: Secondary | ICD-10-CM

## 2017-12-06 NOTE — Telephone Encounter (Signed)
[name] needs an order for a diagnostic mammo with [place] for pt put in Epic.

## 2017-12-07 LAB — CBC WITH DIFFERENTIAL/PLATELET
Basophils Absolute: 28 cells/uL (ref 0–200)
Basophils Relative: 0.6 %
Eosinophils Absolute: 51 cells/uL (ref 15–500)
Eosinophils Relative: 1.1 %
HCT: 32 % — ABNORMAL LOW (ref 35.0–45.0)
Hemoglobin: 9.6 g/dL — ABNORMAL LOW (ref 11.7–15.5)
Lymphs Abs: 1697 cells/uL (ref 850–3900)
MCH: 21 pg — ABNORMAL LOW (ref 27.0–33.0)
MCHC: 30 g/dL — ABNORMAL LOW (ref 32.0–36.0)
MCV: 69.9 fL — ABNORMAL LOW (ref 80.0–100.0)
MPV: 11.6 fL (ref 7.5–12.5)
Monocytes Relative: 8.6 %
Neutro Abs: 2429 cells/uL (ref 1500–7800)
Neutrophils Relative %: 52.8 %
Platelets: 252 10*3/uL (ref 140–400)
RBC: 4.58 10*6/uL (ref 3.80–5.10)
RDW: 16.8 % — ABNORMAL HIGH (ref 11.0–15.0)
Total Lymphocyte: 36.9 %
WBC mixed population: 396 cells/uL (ref 200–950)
WBC: 4.6 10*3/uL (ref 3.8–10.8)

## 2017-12-07 LAB — COMPLETE METABOLIC PANEL WITH GFR
AG Ratio: 1.8 (calc) (ref 1.0–2.5)
ALT: 10 U/L (ref 6–29)
AST: 15 U/L (ref 10–30)
Albumin: 3.8 g/dL (ref 3.6–5.1)
Alkaline phosphatase (APISO): 62 U/L (ref 33–115)
BUN: 8 mg/dL (ref 7–25)
CO2: 27 mmol/L (ref 20–32)
Calcium: 8.8 mg/dL (ref 8.6–10.2)
Chloride: 103 mmol/L (ref 98–110)
Creat: 0.7 mg/dL (ref 0.50–1.10)
GFR, Est African American: 126 mL/min/{1.73_m2} (ref >=60)
GFR, Est Non African American: 108 mL/min/{1.73_m2} (ref >=60)
Globulin: 2.1 g/dL (calc) (ref 1.9–3.7)
Glucose, Bld: 79 mg/dL (ref 65–99)
Potassium: 4.7 mmol/L (ref 3.5–5.3)
Sodium: 137 mmol/L (ref 135–146)
Total Bilirubin: 0.4 mg/dL (ref 0.2–1.2)
Total Protein: 5.9 g/dL — ABNORMAL LOW (ref 6.1–8.1)

## 2017-12-07 LAB — CBC MORPHOLOGY

## 2017-12-10 ENCOUNTER — Encounter: Payer: Self-pay | Admitting: Nurse Practitioner

## 2017-12-10 DIAGNOSIS — D509 Iron deficiency anemia, unspecified: Secondary | ICD-10-CM

## 2017-12-12 ENCOUNTER — Telehealth: Payer: Self-pay | Admitting: Nurse Practitioner

## 2017-12-12 DIAGNOSIS — R200 Anesthesia of skin: Secondary | ICD-10-CM

## 2017-12-12 DIAGNOSIS — R531 Weakness: Secondary | ICD-10-CM

## 2017-12-12 DIAGNOSIS — R4781 Slurred speech: Secondary | ICD-10-CM

## 2017-12-12 DIAGNOSIS — Z820 Family history of epilepsy and other diseases of the nervous system: Secondary | ICD-10-CM

## 2017-12-12 DIAGNOSIS — R202 Paresthesia of skin: Principal | ICD-10-CM

## 2017-12-12 LAB — IRON,TIBC AND FERRITIN PANEL
%SAT: 2 % (calc) — ABNORMAL LOW (ref 16–45)
Ferritin: 2 ng/mL — ABNORMAL LOW (ref 16–154)
Iron: 10 ug/dL — ABNORMAL LOW (ref 40–190)
TIBC: 446 mcg/dL (calc) (ref 250–450)

## 2017-12-12 LAB — TEST AUTHORIZATION

## 2017-12-12 LAB — B12 AND FOLATE PANEL
Folate: 7.2 ng/mL
Vitamin B-12: 119 pg/mL — ABNORMAL LOW (ref 200–1100)

## 2017-12-12 LAB — MAGNESIUM: Magnesium: 1.8 mg/dL (ref 1.5–2.5)

## 2017-12-12 MED ORDER — FERROUS SULFATE 325 (65 FE) MG PO TBEC: DELAYED_RELEASE_TABLET | ORAL | 3 refills | Status: DC

## 2017-12-12 NOTE — Telephone Encounter (Signed)
Pt called said that she wanted to star the referral

## 2017-12-16 ENCOUNTER — Telehealth: Payer: Self-pay

## 2017-12-16 NOTE — Telephone Encounter (Signed)
Pt called complaining of new onset Right eye blurriness. She is  also seeing black spots out the corner of her eyes . She states shes been having headaches intermittent for about 1 week. I recommended that the patient go to the ER to be evaluated for new onset vision disturbances.

## 2017-12-16 NOTE — Telephone Encounter (Signed)
Consulted with Kelita prior to advice given.  Since these are new neurological complaints with other new headache for 1 week, recommend ED visit.

## 2017-12-17 ENCOUNTER — Encounter: Payer: Self-pay | Admitting: Nurse Practitioner

## 2017-12-18 ENCOUNTER — Other Ambulatory Visit: Payer: Self-pay

## 2017-12-18 ENCOUNTER — Inpatient Hospital Stay: Payer: PRIVATE HEALTH INSURANCE | Attending: Oncology | Admitting: Oncology

## 2017-12-18 ENCOUNTER — Encounter: Payer: Self-pay | Admitting: Oncology

## 2017-12-18 VITALS — BP 125/78 | HR 69 | Temp 97.3°F | Resp 18 | Ht 66.0 in | Wt 188.8 lb

## 2017-12-18 DIAGNOSIS — Z9884 Bariatric surgery status: Secondary | ICD-10-CM

## 2017-12-18 DIAGNOSIS — D509 Iron deficiency anemia, unspecified: Secondary | ICD-10-CM | POA: Insufficient documentation

## 2017-12-18 DIAGNOSIS — D513 Other dietary vitamin B12 deficiency anemia: Secondary | ICD-10-CM | POA: Diagnosis present

## 2017-12-18 DIAGNOSIS — D518 Other vitamin B12 deficiency anemias: Secondary | ICD-10-CM

## 2017-12-18 DIAGNOSIS — R5383 Other fatigue: Secondary | ICD-10-CM | POA: Diagnosis not present

## 2017-12-18 NOTE — Progress Notes (Signed)
Patient here for follow up. Pt states she feels nauseated almost every day.

## 2017-12-19 ENCOUNTER — Inpatient Hospital Stay: Payer: PRIVATE HEALTH INSURANCE

## 2017-12-19 VITALS — BP 109/70 | HR 72 | Temp 97.5°F | Resp 18

## 2017-12-19 DIAGNOSIS — D509 Iron deficiency anemia, unspecified: Secondary | ICD-10-CM | POA: Insufficient documentation

## 2017-12-19 MED ORDER — CYANOCOBALAMIN 1000 MCG/ML IJ SOLN
1000.0000 ug | Freq: Once | INTRAMUSCULAR | Status: AC
  Administered 2017-12-19: 1000 ug via INTRAMUSCULAR
  Filled 2017-12-19: qty 1

## 2017-12-19 MED ORDER — SODIUM CHLORIDE 0.9 % IV SOLN
510.0000 mg | Freq: Once | INTRAVENOUS | Status: AC
  Administered 2017-12-19: 510 mg via INTRAVENOUS
  Filled 2017-12-19: qty 17

## 2017-12-19 MED ORDER — SODIUM CHLORIDE 0.9 % IV SOLN
Freq: Once | INTRAVENOUS | Status: AC
  Administered 2017-12-19: 15:00:00 via INTRAVENOUS
  Filled 2017-12-19: qty 250

## 2017-12-20 ENCOUNTER — Inpatient Hospital Stay: Payer: PRIVATE HEALTH INSURANCE

## 2017-12-20 DIAGNOSIS — D509 Iron deficiency anemia, unspecified: Secondary | ICD-10-CM | POA: Diagnosis not present

## 2017-12-20 MED ORDER — CYANOCOBALAMIN 1000 MCG/ML IJ SOLN
1000.0000 ug | Freq: Once | INTRAMUSCULAR | Status: AC
  Administered 2017-12-20: 1000 ug via INTRAMUSCULAR

## 2017-12-20 NOTE — Progress Notes (Signed)
Hematology/Oncology Consult note [facility] [phone] [phone]   Patient Care Team: Galen Manila, NP as PCP - General (Nurse Practitioner)  REFERRING PROVIDER: Galen Manila, NP  CHIEF COMPLAINTS/REASON FOR VISIT:  Evaluation of iron deficiency anemia  HISTORY OF PRESENTING ILLNESS:  [name] is a  42 y.o.  female with PMH listed below who was referred to me for evaluation of iron deficiency anemia Reviewed patient's recent labs that was done at [facility] office. 12/06/2017 Labs revealed anemia with hemoglobin of 9.6, mcv 69.9.  Iron panel showed ferritin of 2, iron saturation 2, TIBC 446.  Folate 7.2, vitamin b12 112 Associated signs and symptoms: Patient reports fatigue.  Mild SOB with exertion.  Denies weight loss, easy bruising, hematochezia, hemoptysis, hematuria. 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   Review of Systems  Constitutional: Positive for malaise/fatigue. Negative for chills, fever and weight loss.  HENT: Negative for sore throat.   Eyes: Negative for redness.  Respiratory: Positive for shortness of breath. Negative for cough and wheezing.   Cardiovascular: Negative for chest pain, palpitations and leg swelling.  Gastrointestinal: Negative for abdominal pain, blood in stool, nausea and vomiting.  Genitourinary: Negative for dysuria.  Musculoskeletal: Negative for myalgias.  Skin: Negative for rash.  Neurological: Positive for sensory change. Negative for dizziness, tingling and tremors.  Endo/Heme/Allergies: Does not bruise/bleed easily.  Psychiatric/Behavioral: Negative for hallucinations.    MEDICAL HISTORY:  Past  Medical History:  Diagnosis Date  . Anemia   . Anxiety   . Chronic headaches   . Depression   . Quit smoking 06/29/2016    SURGICAL HISTORY: Past Surgical History:  Procedure Laterality Date  . ABDOMINAL HYSTERECTOMY     partial  . bartholins cyst removal  2017  . BREAST BIOPSY Right 2008   CORE - NEG  . BREAST EXCISIONAL BIOPSY Right 2010   NEG  . CESAREAN SECTION    . GALLBLADDER SURGERY  2012  . GASTRIC BYPASS  2012  . TUBAL LIGATION    . VAGINAL HYSTERECTOMY  2006   ovaries remain    SOCIAL HISTORY: Social History   Socioeconomic History  . Marital status: Widowed    Spouse name: Not on file  . Number of children: Not on file  . Years of education: Not on file  . Highest education level: Not on file  Occupational History  . Not on file  Social Needs  . Financial resource strain: Not very hard  . Food insecurity:    Worry: Sometimes true    Inability: Sometimes true  . Transportation needs:    Medical: No    Non-medical: No  Tobacco Use  . Smoking status: Former Smoker    Packs/day: 1.00    Years: 15.00    Pack years: 15.00    Last attempt to quit: 2018    Years since quitting: 1.8  . Smokeless tobacco: Former Engineer, water and Sexual Activity  . Alcohol use: Yes    Alcohol/week: 0.0 standard drinks  . Drug use: No  . Sexual activity: Yes    Birth control/protection: None  Lifestyle  . Physical activity:    Days per week: 3 days    Minutes per session: 60  min  . Stress: Not on file  Relationships  . Social connections:    Talks on phone: Not on file    Gets together: Not on file    Attends religious service: Not on file    Active member of club or organization: Not on file    Attends meetings of clubs or organizations: Not on file    Relationship status: Not on file  . Intimate partner violence:    Fear of current or ex partner: No    Emotionally abused: No    Physically abused: No    Forced sexual activity: No  Other Topics Concern    . Not on file  Social History Narrative  . Not on file    FAMILY HISTORY: Family History  Problem Relation Age of Onset  . Breast cancer Mother        48'S  . Breast cancer Maternal Grandmother        60'S  . Hypothyroidism Father   . Multiple sclerosis Maternal Aunt   . Multiple sclerosis Maternal Aunt   . Multiple sclerosis Cousin     ALLERGIES:  is allergic to morphine.  MEDICATIONS:  Current Outpatient Medications  Medication Sig Dispense Refill  . ferrous sulfate 325 (65 FE) MG EC tablet Take two tablets (650 mg total) with breakfast on Monday, Wednesday, and Friday.  3   No current facility-administered medications for this visit.      PHYSICAL EXAMINATION: ECOG PERFORMANCE STATUS: 1 - Symptomatic but completely ambulatory Vitals:   12/18/17 1501  BP: 125/78  Pulse: 69  Resp: 18  Temp: (!) 97.3 F (36.3 C)   Filed Weights   12/18/17 1501  Weight: 188 lb 12.8 oz (85.6 kg)    Physical Exam   LABORATORY DATA:  I have reviewed the data as listed Lab Results  Component Value Date   WBC 4.6 12/06/2017   HGB 9.6 (L) 12/06/2017   HCT 32.0 (L) 12/06/2017   MCV 69.9 (L) 12/06/2017   PLT 252 12/06/2017   Recent Labs    12/06/17 0813  NA 137  K 4.7  CL 103  CO2 27  GLUCOSE 79  BUN 8  CREATININE 0.70  CALCIUM 8.8  GFRNONAA 108  GFRAA 126  PROT 5.9*  AST 15  ALT 10  BILITOT 0.4   Iron/TIBC/Ferritin/ %Sat    Component Value Date/Time   IRON 10 (L) 12/06/2017 0818   TIBC 446 12/06/2017 0818   FERRITIN 2 (L) 12/06/2017 0818   IRONPCTSAT 2 (L) 12/06/2017 0818        ASSESSMENT & PLAN:  1. Iron deficiency anemia, unspecified iron deficiency anemia type   2. Vitamin B12 deficiency (dietary) anemia   3. Personal history of gastric bypass    Labs are reviewed and discussed with patient. Consistent with severe iron deficiency anemia. Plan IV iron with Feraheme 510mg  weekly x 2. Allergy reactions/infusion reaction including anaphylactic  reaction discussed with patient. Other side effects include but not limited to high blood pressure, skin rash, weight gain, leg swelling, etc. Patient voices understanding and willing to proceed.  # Vitamin b12 deficiency will start patient on parental vitamin b12 IM daily x 5 followed by weekly x 4.  # Gastric bypass, check vitamin D level.  Patient will need to have IV iron periodically and long term B12 injections.   Orders Placed This Encounter  Procedures  . CBC with Differential/Platelet    Standing Status:   Future    Standing  Expiration Date:   12/18/2018  . Ferritin    Standing Status:   Future    Standing Expiration Date:   12/19/2018  . Iron and TIBC    Standing Status:   Future    Standing Expiration Date:   12/19/2018  . Vitamin B12    Standing Status:   Future    Standing Expiration Date:   12/19/2018  . Vitamin D 25 hydroxy    Standing Status:   Future    Standing Expiration Date:   12/18/2018    All questions were answered. The patient knows to call the clinic with any problems questions or concerns.  Return of visit: 2 weeks Thank you for this kind referral and the opportunity to participate in the care of this patient. A copy of today's note is routed to referring provider  Total face to face encounter time for this patient visit was . >50% of the time was  spent in counseling and coordination of care.    Rickard Patience, MD, PhD Hematology Oncology [facility] at [facility] Pager- [phone] 12/20/2017

## 2017-12-23 ENCOUNTER — Inpatient Hospital Stay: Payer: PRIVATE HEALTH INSURANCE

## 2017-12-23 DIAGNOSIS — D509 Iron deficiency anemia, unspecified: Secondary | ICD-10-CM

## 2017-12-23 MED ORDER — CYANOCOBALAMIN 1000 MCG/ML IJ SOLN
1000.0000 ug | Freq: Once | INTRAMUSCULAR | Status: AC
  Administered 2017-12-23: 1000 ug via INTRAMUSCULAR

## 2017-12-24 ENCOUNTER — Inpatient Hospital Stay: Payer: PRIVATE HEALTH INSURANCE

## 2017-12-24 DIAGNOSIS — D509 Iron deficiency anemia, unspecified: Secondary | ICD-10-CM

## 2017-12-24 MED ORDER — CYANOCOBALAMIN 1000 MCG/ML IJ SOLN
1000.0000 ug | Freq: Once | INTRAMUSCULAR | Status: AC
  Administered 2017-12-24: 1000 ug via INTRAMUSCULAR

## 2017-12-25 ENCOUNTER — Inpatient Hospital Stay: Payer: PRIVATE HEALTH INSURANCE

## 2017-12-25 ENCOUNTER — Ambulatory Visit
Admission: RE | Admit: 2017-12-25 | Discharge: 2017-12-25 | Disposition: A | Discharge status: Home / Self Care | Payer: PRIVATE HEALTH INSURANCE | Source: Ambulatory Visit | Attending: Nurse Practitioner | Admitting: Nurse Practitioner

## 2017-12-25 ENCOUNTER — Ambulatory Visit: Payer: PRIVATE HEALTH INSURANCE

## 2017-12-25 DIAGNOSIS — Z872 Personal history of diseases of the skin and subcutaneous tissue: Secondary | ICD-10-CM

## 2017-12-25 DIAGNOSIS — Z1231 Encounter for screening mammogram for malignant neoplasm of breast: Secondary | ICD-10-CM

## 2017-12-25 DIAGNOSIS — D509 Iron deficiency anemia, unspecified: Secondary | ICD-10-CM

## 2017-12-25 MED ORDER — CYANOCOBALAMIN 1000 MCG/ML IJ SOLN
1000.0000 ug | Freq: Once | INTRAMUSCULAR | Status: AC
  Administered 2017-12-25: 1000 ug via INTRAMUSCULAR

## 2017-12-30 ENCOUNTER — Inpatient Hospital Stay: Payer: PRIVATE HEALTH INSURANCE | Attending: Oncology

## 2017-12-30 VITALS — BP 107/76 | HR 56 | Temp 97.5°F | Resp 16

## 2017-12-30 DIAGNOSIS — D513 Other dietary vitamin B12 deficiency anemia: Secondary | ICD-10-CM | POA: Diagnosis not present

## 2017-12-30 DIAGNOSIS — D509 Iron deficiency anemia, unspecified: Secondary | ICD-10-CM | POA: Insufficient documentation

## 2017-12-30 MED ORDER — SODIUM CHLORIDE 0.9 % IV SOLN
Freq: Once | INTRAVENOUS | Status: AC
  Administered 2017-12-30: 14:00:00 via INTRAVENOUS
  Filled 2017-12-30: qty 250

## 2017-12-30 MED ORDER — SODIUM CHLORIDE 0.9 % IV SOLN
510.0000 mg | Freq: Once | INTRAVENOUS | Status: AC
  Administered 2017-12-30: 510 mg via INTRAVENOUS
  Filled 2017-12-30: qty 17

## 2017-12-30 MED ORDER — CYANOCOBALAMIN 1000 MCG/ML IJ SOLN
1000.0000 ug | Freq: Once | INTRAMUSCULAR | Status: AC
  Administered 2017-12-30: 1000 ug via INTRAMUSCULAR
  Filled 2017-12-30: qty 1

## 2018-01-01 ENCOUNTER — Ambulatory Visit: Payer: PRIVATE HEALTH INSURANCE

## 2018-01-01 ENCOUNTER — Inpatient Hospital Stay: Payer: PRIVATE HEALTH INSURANCE

## 2018-01-01 DIAGNOSIS — D509 Iron deficiency anemia, unspecified: Secondary | ICD-10-CM

## 2018-01-01 MED ORDER — CYANOCOBALAMIN 1000 MCG/ML IJ SOLN
1000.0000 ug | Freq: Once | INTRAMUSCULAR | Status: AC
  Administered 2018-01-01: 1000 ug via INTRAMUSCULAR

## 2018-01-08 ENCOUNTER — Inpatient Hospital Stay: Payer: PRIVATE HEALTH INSURANCE

## 2018-01-08 DIAGNOSIS — D509 Iron deficiency anemia, unspecified: Secondary | ICD-10-CM

## 2018-01-08 MED ORDER — CYANOCOBALAMIN 1000 MCG/ML IJ SOLN
1000.0000 ug | Freq: Once | INTRAMUSCULAR | Status: AC
  Administered 2018-01-08: 1000 ug via INTRAMUSCULAR

## 2018-01-15 ENCOUNTER — Inpatient Hospital Stay: Payer: PRIVATE HEALTH INSURANCE

## 2018-01-23 ENCOUNTER — Inpatient Hospital Stay: Payer: PRIVATE HEALTH INSURANCE

## 2018-01-23 ENCOUNTER — Telehealth: Payer: Self-pay | Admitting: *Deleted

## 2018-01-23 MED ORDER — CYANOCOBALAMIN 1000 MCG/ML IJ SOLN: 1000.0000 ug | Freq: Once | INTRAMUSCULAR | Status: DC

## 2018-01-23 NOTE — Telephone Encounter (Signed)
Patient came to front desk with armband after waiting for B12 injection for approx 15 mins and stated that she had been waiting too long, she was not waiting anymore and did NOT want her insurance charged.  She put her armband on the desk and walked out, pt did not give [place] time to investigate or speak to her about her wait time.  Registration notified.

## 2018-01-23 NOTE — Progress Notes (Signed)
Patient left before injection was administered.

## 2018-02-12 ENCOUNTER — Inpatient Hospital Stay: Payer: PRIVATE HEALTH INSURANCE | Attending: Oncology

## 2018-02-12 DIAGNOSIS — D509 Iron deficiency anemia, unspecified: Secondary | ICD-10-CM | POA: Diagnosis not present

## 2018-02-12 DIAGNOSIS — E538 Deficiency of other specified B group vitamins: Secondary | ICD-10-CM | POA: Insufficient documentation

## 2018-02-12 DIAGNOSIS — E559 Vitamin D deficiency, unspecified: Secondary | ICD-10-CM | POA: Insufficient documentation

## 2018-02-12 LAB — IRON AND TIBC
IRON: 49 ug/dL (ref 28–170)
Saturation Ratios: 12 % (ref 10.4–31.8)
TIBC: 397 ug/dL (ref 250–450)
UIBC: 349 ug/dL

## 2018-02-12 LAB — CBC WITH DIFFERENTIAL/PLATELET
Abs Immature Granulocytes: 0.02 10*3/uL (ref 0.00–0.07)
Basophils Absolute: 0 10*3/uL (ref 0.0–0.1)
Basophils Relative: 1 %
Eosinophils Absolute: 0.3 10*3/uL (ref 0.0–0.5)
Eosinophils Relative: 4 %
HEMATOCRIT: 44.8 % (ref 36.0–46.0)
Hemoglobin: 14.2 g/dL (ref 12.0–15.0)
IMMATURE GRANULOCYTES: 0 %
LYMPHS PCT: 30 %
Lymphs Abs: 2.3 10*3/uL (ref 0.7–4.0)
MCH: 25.8 pg — ABNORMAL LOW (ref 26.0–34.0)
MCHC: 31.7 g/dL (ref 30.0–36.0)
MCV: 81.3 fL (ref 80.0–100.0)
MONO ABS: 0.6 10*3/uL (ref 0.1–1.0)
MONOS PCT: 8 %
NEUTROS ABS: 4.4 10*3/uL (ref 1.7–7.7)
Neutrophils Relative %: 57 %
PLATELETS: 164 10*3/uL (ref 150–400)
RBC: 5.51 MIL/uL — ABNORMAL HIGH (ref 3.87–5.11)
RDW: 21.7 % — ABNORMAL HIGH (ref 11.5–15.5)
WBC: 7.7 10*3/uL (ref 4.0–10.5)
nRBC: 0 % (ref 0.0–0.2)

## 2018-02-12 LAB — VITAMIN B12: Vitamin B-12: 148 pg/mL — ABNORMAL LOW (ref 180–914)

## 2018-02-12 LAB — FERRITIN: Ferritin: 11 ng/mL (ref 11–307)

## 2018-02-13 LAB — VITAMIN D 25 HYDROXY (VIT D DEFICIENCY, FRACTURES): VIT D 25 HYDROXY: 12.3 ng/mL — AB (ref 30.0–100.0)

## 2018-02-14 ENCOUNTER — Inpatient Hospital Stay: Payer: PRIVATE HEALTH INSURANCE

## 2018-02-14 ENCOUNTER — Encounter: Payer: Self-pay | Admitting: Oncology

## 2018-02-14 ENCOUNTER — Inpatient Hospital Stay (HOSPITAL_BASED_OUTPATIENT_CLINIC_OR_DEPARTMENT_OTHER): Payer: PRIVATE HEALTH INSURANCE | Admitting: Oncology

## 2018-02-14 VITALS — BP 114/79 | HR 66 | Temp 97.0°F | Resp 18 | Wt 191.6 lb

## 2018-02-14 DIAGNOSIS — E559 Vitamin D deficiency, unspecified: Secondary | ICD-10-CM

## 2018-02-14 DIAGNOSIS — D509 Iron deficiency anemia, unspecified: Secondary | ICD-10-CM | POA: Diagnosis not present

## 2018-02-14 DIAGNOSIS — E538 Deficiency of other specified B group vitamins: Secondary | ICD-10-CM | POA: Diagnosis not present

## 2018-02-14 DIAGNOSIS — Z9884 Bariatric surgery status: Secondary | ICD-10-CM

## 2018-02-14 DIAGNOSIS — D518 Other vitamin B12 deficiency anemias: Secondary | ICD-10-CM

## 2018-02-14 MED ORDER — CYANOCOBALAMIN 1000 MCG/ML IJ SOLN
1000.0000 ug | Freq: Once | INTRAMUSCULAR | Status: AC
  Administered 2018-02-14: 1000 ug via INTRAMUSCULAR
  Filled 2018-02-14: qty 1

## 2018-02-14 NOTE — Progress Notes (Signed)
Pt in for follow up, reports increased fatigue since last visit.

## 2018-02-14 NOTE — Progress Notes (Signed)
Hematology/Oncology follow up  note [facility] [phone] Fax:(336) [phone]   Patient Care Team: Galen Manila, NP as PCP - General (Nurse Practitioner)  REFERRING PROVIDER: Galen Manila, NP  CHIEF COMPLAINTS/REASON FOR VISIT:  Evaluation of iron deficiency anemia  HISTORY OF PRESENTING ILLNESS:  [name] is a  43 y.o.  female with PMH listed below who was referred to me for evaluation of iron deficiency anemia Reviewed patient's recent labs that was done at [facility] office. 12/06/2017 Labs revealed anemia with hemoglobin of 9.6, mcv 69.9.  Iron panel showed ferritin of 2, iron saturation 2, TIBC 446.  Folate 7.2, vitamin b12 112 Associated signs and symptoms: Patient reports fatigue.  Mild SOB with exertion.  Denies weight loss, easy bruising, hematochezia, hemoptysis, hematuria. 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   INTERVAL HISTORY [name] is a 43 y.o. 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  #Fatigue has improved since received IV iron.  Shortness of breath has resolved. Still has some mild numbness and tingling of fingertips.  No balancing problems. Denies any shortness of breath, chest pain, abdominal pain, unintentional weight loss, night sweats, fever or chills.  Review of Systems  Constitutional: Negative for chills, fever, malaise/fatigue and weight loss.  HENT: Negative for sore throat.   Eyes: Negative for redness.  Respiratory: Negative for cough, shortness of  breath and wheezing.   Cardiovascular: Negative for chest pain, palpitations and leg swelling.  Gastrointestinal: Negative for abdominal pain, blood in stool, nausea and vomiting.  Genitourinary: Negative for dysuria.  Musculoskeletal: Negative for myalgias.  Skin: Negative for rash.  Neurological: Positive for sensory change. Negative for dizziness, tingling and tremors.  Endo/Heme/Allergies: Does not bruise/bleed easily.  Psychiatric/Behavioral: Negative for hallucinations.    MEDICAL HISTORY:  Past Medical History:  Diagnosis Date  . Anemia   . Anxiety   . Chronic headaches   . Depression   . Quit smoking 06/29/2016    SURGICAL HISTORY: Past Surgical History:  Procedure Laterality Date  . ABDOMINAL HYSTERECTOMY     partial  . bartholins cyst removal  2017  . BREAST BIOPSY Right 2008   CORE - NEG  . BREAST EXCISIONAL BIOPSY Right 2010   NEG  . CESAREAN SECTION    . GALLBLADDER SURGERY  2012  . GASTRIC BYPASS  2012  . TUBAL LIGATION    . VAGINAL HYSTERECTOMY  2006   ovaries remain    SOCIAL HISTORY: Social History   Socioeconomic History  . Marital status: Widowed    Spouse name: Not on file  . Number of children: Not on file  . Years of education: Not on file  . Highest education level: Not on file  Occupational History  . Not on file  Social Needs  . Financial resource strain: Not very hard  . Food insecurity:    Worry: Sometimes true    Inability: Sometimes true  . Transportation needs:    Medical: No    Non-medical: No  Tobacco Use  . Smoking status: Former Smoker    Packs/day: 1.00  Years: 15.00    Pack years: 15.00    Last attempt to quit: 2018    Years since quitting: 2.0  . Smokeless tobacco: Former Engineer, water and Sexual Activity  . Alcohol use: Yes    Alcohol/week: 0.0 standard drinks  . Drug use: No  . Sexual activity: Yes    Birth control/protection: None  Lifestyle  . Physical activity:    Days per week: 3 days    Minutes  per session: 60 min  . Stress: Not on file  Relationships  . Social connections:    Talks on phone: Not on file    Gets together: Not on file    Attends religious service: Not on file    Active member of club or organization: Not on file    Attends meetings of clubs or organizations: Not on file    Relationship status: Not on file  . Intimate partner violence:    Fear of current or ex partner: No    Emotionally abused: No    Physically abused: No    Forced sexual activity: No  Other Topics Concern  . Not on file  Social History Narrative  . Not on file    FAMILY HISTORY: Family History  Problem Relation Age of Onset  . Breast cancer Mother        33'S  . Breast cancer Maternal Grandmother        60'S  . Hypothyroidism Father   . Multiple sclerosis Maternal Aunt   . Multiple sclerosis Maternal Aunt   . Multiple sclerosis Cousin     ALLERGIES:  is allergic to morphine.  MEDICATIONS:  No current outpatient medications on file.   No current facility-administered medications for this visit.      PHYSICAL EXAMINATION: ECOG PERFORMANCE STATUS: 1 - Symptomatic but completely ambulatory Vitals:   02/14/18 1326  BP: 114/79  Pulse: 66  Resp: 18  Temp: (!) 97 F (36.1 C)  SpO2: 97%   Filed Weights   02/14/18 1326  Weight: 191 lb 9 oz (86.9 kg)    Physical Exam Constitutional:      General: She is not in acute distress. HENT:     Head: Normocephalic and atraumatic.  Eyes:     General: No scleral icterus.    Pupils: Pupils are equal, round, and reactive to light.  Neck:     Musculoskeletal: Normal range of motion and neck supple.  Cardiovascular:     Rate and Rhythm: Normal rate and regular rhythm.     Heart sounds: Normal heart sounds.  Pulmonary:     Effort: Pulmonary effort is normal. No respiratory distress.     Breath sounds: No wheezing.  Abdominal:     General: Bowel sounds are normal. There is no distension.     Palpations: Abdomen is soft. There  is no mass.     Tenderness: There is no abdominal tenderness.  Musculoskeletal: Normal range of motion.        General: No deformity.  Skin:    General: Skin is warm and dry.     Findings: No erythema or rash.  Neurological:     Mental Status: She is alert and oriented to person, place, and time.     Cranial Nerves: No cranial nerve deficit.     Coordination: Coordination normal.  Psychiatric:        Behavior: Behavior normal.        Thought Content: Thought content normal.  LABORATORY DATA:  I have reviewed the data as listed Lab Results  Component Value Date   WBC 7.7 02/12/2018   HGB 14.2 02/12/2018   HCT 44.8 02/12/2018   MCV 81.3 02/12/2018   PLT 164 02/12/2018   Recent Labs    12/06/17 0813  NA 137  K 4.7  CL 103  CO2 27  GLUCOSE 79  BUN 8  CREATININE 0.70  CALCIUM 8.8  GFRNONAA 108  GFRAA 126  PROT 5.9*  AST 15  ALT 10  BILITOT 0.4   Iron/TIBC/Ferritin/ %Sat    Component Value Date/Time   IRON 49 02/12/2018 1501   TIBC 397 02/12/2018 1501   FERRITIN 11 02/12/2018 1501   IRONPCTSAT 12 02/12/2018 1501   IRONPCTSAT 2 (L) 12/06/2017 0818        ASSESSMENT & PLAN:  1. Iron deficiency anemia, unspecified iron deficiency anemia type   2. Vitamin B12 deficiency (dietary) anemia   3. Personal history of gastric bypass   4. Vitamin D deficiency    #Labs are reviewed and discussed with patient. Iron panel showed improved iron stores.  Ferritin 11, iron saturation of 12. Recommend another dose of IV Feraheme 510 mg today. Repeat CBC and iron panel in 4 months.  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 Patient has had finished a course of daily parenteral vitamin B12 x 5 followed by weekly x4.  B12 still low at 148. Will proceed with B12 weekly x 3. Followed by monthly B12 1000 MCG IM.  #Vitamin D deficiency Recommend patient to take daily vitamin D 2000 unit supplements.  She may buy  over-the-counter.   Orders Placed This Encounter  Procedures  . CBC with Differential/Platelet    Standing Status:   Future    Standing Expiration Date:   02/17/2019  . Vitamin B12    Standing Status:   Future    Standing Expiration Date:   02/17/2019  . Iron and TIBC    Standing Status:   Future    Standing Expiration Date:   02/17/2019  . Ferritin    Standing Status:   Future    Standing Expiration Date:   02/17/2019    All questions were answered. The patient knows to call the clinic with any problems questions or concerns.  Return of visit:  Monthly B12 injection x 3. Check cbc, iron, TIBC ferritin, Vitamin D in 18 weeks.    Rickard Patience, MD, PhD Hematology Oncology [facility] at [facility] Pager- [phone] 02/14/2018

## 2018-02-17 ENCOUNTER — Other Ambulatory Visit: Payer: Self-pay

## 2018-02-17 MED ORDER — VITAMIN D 25 MCG (1000 UNIT) PO TABS: 2000.0000 [IU] | ORAL_TABLET | Freq: Every day | ORAL | 3 refills | Status: DC

## 2018-02-21 ENCOUNTER — Inpatient Hospital Stay: Payer: PRIVATE HEALTH INSURANCE

## 2018-02-21 DIAGNOSIS — D509 Iron deficiency anemia, unspecified: Secondary | ICD-10-CM | POA: Diagnosis not present

## 2018-02-21 MED ORDER — CYANOCOBALAMIN 1000 MCG/ML IJ SOLN
1000.0000 ug | Freq: Once | INTRAMUSCULAR | Status: AC
  Administered 2018-02-21: 1000 ug via INTRAMUSCULAR

## 2018-02-28 ENCOUNTER — Inpatient Hospital Stay: Payer: PRIVATE HEALTH INSURANCE

## 2018-02-28 DIAGNOSIS — D509 Iron deficiency anemia, unspecified: Secondary | ICD-10-CM

## 2018-02-28 MED ORDER — CYANOCOBALAMIN 1000 MCG/ML IJ SOLN
1000.0000 ug | Freq: Once | INTRAMUSCULAR | Status: AC
  Administered 2018-02-28: 1000 ug via INTRAMUSCULAR

## 2018-03-20 ENCOUNTER — Other Ambulatory Visit (HOSPITAL_COMMUNITY): Payer: Self-pay | Admitting: Acute Care

## 2018-03-20 ENCOUNTER — Other Ambulatory Visit: Payer: Self-pay | Admitting: Acute Care

## 2018-03-20 DIAGNOSIS — G35 Multiple sclerosis: Secondary | ICD-10-CM

## 2018-03-28 ENCOUNTER — Inpatient Hospital Stay: Payer: PRIVATE HEALTH INSURANCE | Attending: Oncology

## 2018-03-28 DIAGNOSIS — D518 Other vitamin B12 deficiency anemias: Secondary | ICD-10-CM | POA: Diagnosis present

## 2018-03-28 DIAGNOSIS — D509 Iron deficiency anemia, unspecified: Secondary | ICD-10-CM

## 2018-03-28 MED ORDER — CYANOCOBALAMIN 1000 MCG/ML IJ SOLN
1000.0000 ug | Freq: Once | INTRAMUSCULAR | Status: AC
  Administered 2018-03-28: 1000 ug via INTRAMUSCULAR

## 2018-04-02 ENCOUNTER — Other Ambulatory Visit: Payer: Self-pay

## 2018-04-02 ENCOUNTER — Ambulatory Visit
Admission: RE | Admit: 2018-04-02 | Discharge: 2018-04-02 | Disposition: A | Discharge status: Home / Self Care | Payer: PRIVATE HEALTH INSURANCE | Source: Ambulatory Visit | Attending: Acute Care | Admitting: Acute Care

## 2018-04-02 DIAGNOSIS — G35 Multiple sclerosis: Secondary | ICD-10-CM | POA: Diagnosis present

## 2018-04-02 MED ORDER — GADOBUTROL 1 MMOL/ML IV SOLN
8.0000 mL | Freq: Once | INTRAVENOUS | Status: AC | PRN
  Administered 2018-04-02: 8 mL via INTRAVENOUS

## 2018-04-25 ENCOUNTER — Inpatient Hospital Stay: Payer: PRIVATE HEALTH INSURANCE

## 2018-05-22 ENCOUNTER — Other Ambulatory Visit: Payer: Self-pay

## 2018-05-23 ENCOUNTER — Inpatient Hospital Stay: Payer: PRIVATE HEALTH INSURANCE

## 2018-06-17 ENCOUNTER — Other Ambulatory Visit: Payer: Self-pay

## 2018-06-18 ENCOUNTER — Inpatient Hospital Stay: Payer: PRIVATE HEALTH INSURANCE | Attending: Oncology

## 2018-06-18 ENCOUNTER — Inpatient Hospital Stay: Payer: PRIVATE HEALTH INSURANCE

## 2018-06-18 ENCOUNTER — Other Ambulatory Visit: Payer: Self-pay

## 2018-06-18 DIAGNOSIS — D518 Other vitamin B12 deficiency anemias: Secondary | ICD-10-CM

## 2018-06-18 DIAGNOSIS — D509 Iron deficiency anemia, unspecified: Secondary | ICD-10-CM | POA: Diagnosis present

## 2018-06-18 DIAGNOSIS — Z9884 Bariatric surgery status: Secondary | ICD-10-CM

## 2018-06-18 DIAGNOSIS — E538 Deficiency of other specified B group vitamins: Secondary | ICD-10-CM | POA: Insufficient documentation

## 2018-06-18 DIAGNOSIS — E559 Vitamin D deficiency, unspecified: Secondary | ICD-10-CM

## 2018-06-18 LAB — CBC WITH DIFFERENTIAL/PLATELET
Abs Immature Granulocytes: 0.02 10*3/uL (ref 0.00–0.07)
Basophils Absolute: 0 10*3/uL (ref 0.0–0.1)
Basophils Relative: 1 %
Eosinophils Absolute: 0.3 10*3/uL (ref 0.0–0.5)
Eosinophils Relative: 4 %
HCT: 42.2 % (ref 36.0–46.0)
Hemoglobin: 13.6 g/dL (ref 12.0–15.0)
Immature Granulocytes: 0 %
Lymphocytes Relative: 33 %
Lymphs Abs: 2.5 10*3/uL (ref 0.7–4.0)
MCH: 28.9 pg (ref 26.0–34.0)
MCHC: 32.2 g/dL (ref 30.0–36.0)
MCV: 89.6 fL (ref 80.0–100.0)
Monocytes Absolute: 0.4 10*3/uL (ref 0.1–1.0)
Monocytes Relative: 6 %
Neutro Abs: 4.3 10*3/uL (ref 1.7–7.7)
Neutrophils Relative %: 56 %
Platelets: 162 10*3/uL (ref 150–400)
RBC: 4.71 MIL/uL (ref 3.87–5.11)
RDW: 13.2 % (ref 11.5–15.5)
WBC: 7.6 10*3/uL (ref 4.0–10.5)
nRBC: 0 % (ref 0.0–0.2)

## 2018-06-18 LAB — IRON AND TIBC
Iron: 49 ug/dL (ref 28–170)
Saturation Ratios: 12 % (ref 10.4–31.8)
TIBC: 398 ug/dL (ref 250–450)
UIBC: 349 ug/dL

## 2018-06-18 LAB — VITAMIN B12: Vitamin B-12: 107 pg/mL — ABNORMAL LOW (ref 180–914)

## 2018-06-18 LAB — FERRITIN: Ferritin: 5 ng/mL — ABNORMAL LOW (ref 11–307)

## 2018-06-18 MED ORDER — CYANOCOBALAMIN 1000 MCG/ML IJ SOLN
1000.0000 ug | Freq: Once | INTRAMUSCULAR | Status: AC
  Administered 2018-06-18: 1000 ug via INTRAMUSCULAR

## 2018-06-19 LAB — VITAMIN D 25 HYDROXY (VIT D DEFICIENCY, FRACTURES): Vit D, 25-Hydroxy: 29.1 ng/mL — ABNORMAL LOW (ref 30.0–100.0)

## 2018-06-20 ENCOUNTER — Other Ambulatory Visit: Payer: Self-pay

## 2018-06-20 ENCOUNTER — Ambulatory Visit: Payer: PRIVATE HEALTH INSURANCE

## 2018-06-20 ENCOUNTER — Encounter: Payer: Self-pay | Admitting: Oncology

## 2018-06-20 ENCOUNTER — Inpatient Hospital Stay (HOSPITAL_BASED_OUTPATIENT_CLINIC_OR_DEPARTMENT_OTHER): Payer: PRIVATE HEALTH INSURANCE | Admitting: Oncology

## 2018-06-20 DIAGNOSIS — Z9884 Bariatric surgery status: Secondary | ICD-10-CM

## 2018-06-20 DIAGNOSIS — E559 Vitamin D deficiency, unspecified: Secondary | ICD-10-CM

## 2018-06-20 DIAGNOSIS — D509 Iron deficiency anemia, unspecified: Secondary | ICD-10-CM

## 2018-06-20 DIAGNOSIS — D518 Other vitamin B12 deficiency anemias: Secondary | ICD-10-CM | POA: Diagnosis not present

## 2018-06-20 NOTE — Progress Notes (Signed)
HEMATOLOGY-ONCOLOGY TeleHEALTH VISIT PROGRESS NOTE  I connected with [name] on 06/20/18 at  1:45 PM EDT by video enabled telemedicine visit and verified that I am speaking with the correct person using two identifiers. I discussed the limitations, risks, security and privacy concerns of performing an evaluation and management service by telemedicine and the availability of in-person appointments. I also discussed with the patient that there may be a patient responsible charge related to this service. The patient expressed understanding and agreed to proceed.   I attempted to connect the patient for visual enabled telehealth visit via Doximity.  Due to the technical difficulties with video,  Patient was transitioned to audio only visit.  Other persons participating in the visit and their role in the encounter:  Diamantina Monks, CMA, check in patient   Patient's location: Home  Provider's location: work Chief Complaint: Follow-up for management of iron deficiency anemia, B12 deficiency, history of gastric bypass.   INTERVAL HISTORY [name] is a 43 y.o. female who has above history reviewed by me today presents for follow up visit for management of iron deficiency anemia, B12 deficiency history of gastric bypass. Problems and complaints are listed below:  Fatigue: reports worsening fatigue. Chronic onset, perisistent, no aggravating or improving factors, no associated symptoms.  Occasionally shortness of breath with exertion.  Otherwise no new complaints. She takes vitamin B12 tablets. Denies hematochezia, hematuria, hematemesis, epistaxis, black tarry stool or easy bruising.  History of gastric bypass, history of ulcer.  Has not follow-up with GI recently.  Review of Systems  Constitutional: Positive for fatigue. Negative for appetite change, chills and fever.  HENT:   Negative for hearing loss and voice change.   Eyes: Negative for eye problems.  Respiratory:  Positive for shortness of breath. Negative for chest tightness and cough.   Cardiovascular: Negative for chest pain.  Gastrointestinal: Negative for abdominal distention, abdominal pain and blood in stool.  Endocrine: Negative for hot flashes.  Genitourinary: Negative for difficulty urinating and frequency.   Musculoskeletal: Negative for arthralgias.  Skin: Negative for itching and rash.  Neurological: Negative for extremity weakness.  Hematological: Negative for adenopathy.  Psychiatric/Behavioral: Negative for confusion.    Past Medical History:  Diagnosis Date  . Anemia   . Anxiety   . Chronic headaches   . Depression   . Quit smoking 06/29/2016   Past Surgical History:  Procedure Laterality Date  . ABDOMINAL HYSTERECTOMY     partial  . bartholins cyst removal  2017  . BREAST BIOPSY Right 2008   CORE - NEG  . BREAST EXCISIONAL BIOPSY Right 2010   NEG  . CESAREAN SECTION    . GALLBLADDER SURGERY  2012  . GASTRIC BYPASS  2012  . TUBAL LIGATION    . VAGINAL HYSTERECTOMY  2006   ovaries remain    Family History  Problem Relation Age of Onset  . Breast cancer Mother        68'S  . Breast cancer Maternal Grandmother        60'S  . Hypothyroidism Father   . Multiple sclerosis Maternal Aunt   . Multiple sclerosis Maternal Aunt   . Multiple sclerosis Cousin     Social History   Socioeconomic History  . Marital status: Widowed    Spouse name: Not on file  . Number of children: Not on file  . Years of education: Not on file  . Highest education level: Not on file  Occupational History  . Not  on file  Social Needs  . Financial resource strain: Not very hard  . Food insecurity:    Worry: Sometimes true    Inability: Sometimes true  . Transportation needs:    Medical: No    Non-medical: No  Tobacco Use  . Smoking status: Former Smoker    Packs/day: 1.00    Years: 15.00    Pack years: 15.00    Last attempt to quit: 2018    Years since quitting: 2.3  .  Smokeless tobacco: Former Engineer, waterUser  Substance and Sexual Activity  . Alcohol use: Yes    Alcohol/week: 0.0 standard drinks  . Drug use: No  . Sexual activity: Yes    Birth control/protection: None  Lifestyle  . Physical activity:    Days per week: 3 days    Minutes per session: 60 min  . Stress: Not on file  Relationships  . Social connections:    Talks on phone: Not on file    Gets together: Not on file    Attends religious service: Not on file    Active member of club or organization: Not on file    Attends meetings of clubs or organizations: Not on file    Relationship status: Not on file  . Intimate partner violence:    Fear of current or ex partner: No    Emotionally abused: No    Physically abused: No    Forced sexual activity: No  Other Topics Concern  . Not on file  Social History Narrative  . Not on file    Current Outpatient Medications on File Prior to Visit  Medication Sig Dispense Refill  . cholecalciferol (VITAMIN D3) 25 MCG (1000 UT) tablet Take 2 tablets (2,000 Units total) by mouth daily. 60 tablet 3   No current facility-administered medications on file prior to visit.     Allergies  Allergen Reactions  . Morphine Itching       Observations/Objective: There were no vitals filed for this visit. There is no height or weight on file to calculate BMI.  Physical Exam  Constitutional: She is oriented to person, place, and time.  Neurological: She is alert and oriented to person, place, and time.    CBC    Component Value Date/Time   WBC 7.6 06/18/2018 1337   RBC 4.71 06/18/2018 1337   HGB 13.6 06/18/2018 1337   HGB 14.3 09/15/2012 1631   HCT 42.2 06/18/2018 1337   HCT 41.3 09/15/2012 1631   PLT 162 06/18/2018 1337   PLT 154 09/15/2012 1631   MCV 89.6 06/18/2018 1337   MCV 89 09/15/2012 1631   MCH 28.9 06/18/2018 1337   MCHC 32.2 06/18/2018 1337   RDW 13.2 06/18/2018 1337   RDW 13.1 09/15/2012 1631   LYMPHSABS 2.5 06/18/2018 1337   LYMPHSABS  3.7 (H) 07/05/2011 0504   MONOABS 0.4 06/18/2018 1337   MONOABS 0.4 07/05/2011 0504   EOSABS 0.3 06/18/2018 1337   EOSABS 0.2 07/05/2011 0504   BASOSABS 0.0 06/18/2018 1337   BASOSABS 0.0 07/05/2011 0504    CMP     Component Value Date/Time   NA 137 12/06/2017 0813   NA 140 09/15/2012 1631   K 4.7 12/06/2017 0813   K 3.9 09/15/2012 1631   CL 103 12/06/2017 0813   CL 108 (H) 09/15/2012 1631   CO2 27 12/06/2017 0813   CO2 29 09/15/2012 1631   GLUCOSE 79 12/06/2017 0813   GLUCOSE 103 (H) 09/15/2012 1631   BUN 8  12/06/2017 0813   BUN 9 09/15/2012 1631   CREATININE 0.70 12/06/2017 0813   CALCIUM 8.8 12/06/2017 0813   CALCIUM 8.6 09/15/2012 1631   PROT 5.9 (L) 12/06/2017 0813   PROT 5.9 (L) 09/15/2012 1631   ALBUMIN 3.3 (L) 09/15/2012 1631   AST 15 12/06/2017 0813   AST 16 09/15/2012 1631   ALT 10 12/06/2017 0813   ALT 17 09/15/2012 1631   ALKPHOS 72 09/15/2012 1631   BILITOT 0.4 12/06/2017 0813   BILITOT 0.3 09/15/2012 1631   GFRNONAA 108 12/06/2017 0813   GFRAA 126 12/06/2017 0813     Assessment and Plan: 1. Iron deficiency anemia, unspecified iron deficiency anemia type   2. Vitamin B12 deficiency (dietary) anemia   3. Vitamin D deficiency   4. Personal history of gastric bypass     Labs are reviewed and discussed with patient. Iron panel shows ongoing iron deficiency.  Hemoglobin stable at 13.6. Recommend proceed with ferritin 510 mg every 2 weeks x2. Vitamin B12 deficiency, vitamin B12 level at 107, will restart patient on monthly B12 injections.  Most recent B12 injection on 06/18/2018.  Vitamin D deficiency, slightly low level.  Continue vitamin D3 2000 unit daily. Personal history of gastric bypass, I discussed the labs with patient.  Iron deficient anemia likely secondary to combination of malabsorption as well as potential underlying blood loss from GI tract.  I encourage patient to make a follow-up appointment with Dr. Marva Panda for further evaluation. Will  refer her to establish care.  Follow Up Instructions: Lab MD 3 months, check CBC, CMP, vitamin B12, iron, TIBC, ferritin   I discussed the assessment and treatment plan with the patient. The patient was provided an opportunity to ask questions and all were answered. The patient agreed with the plan and demonstrated an understanding of the instructions.  The patient was advised to call back or seek an in-person evaluation if the symptoms worsen or if the condition fails to improve as anticipated.   Rickard Patience, MD 06/20/2018 12:46 PM

## 2018-06-20 NOTE — Progress Notes (Signed)
Called patient today for Telehealth visit via Doximity.  Patient states no new concerns today.  Patient states that she does have fatigue and some SOB at times.

## 2018-06-27 ENCOUNTER — Other Ambulatory Visit: Payer: Self-pay

## 2018-06-27 ENCOUNTER — Inpatient Hospital Stay: Payer: PRIVATE HEALTH INSURANCE

## 2018-06-27 VITALS — BP 126/83 | HR 64 | Temp 97.0°F | Resp 20

## 2018-06-27 DIAGNOSIS — D509 Iron deficiency anemia, unspecified: Secondary | ICD-10-CM

## 2018-06-27 DIAGNOSIS — E538 Deficiency of other specified B group vitamins: Secondary | ICD-10-CM | POA: Diagnosis not present

## 2018-06-27 MED ORDER — SODIUM CHLORIDE 0.9 % IV SOLN
INTRAVENOUS | Status: DC
  Administered 2018-06-27: 14:00:00 via INTRAVENOUS
  Filled 2018-06-27: qty 250

## 2018-06-27 MED ORDER — SODIUM CHLORIDE 0.9 % IV SOLN
510.0000 mg | Freq: Once | INTRAVENOUS | Status: AC
  Administered 2018-06-27: 14:00:00 510 mg via INTRAVENOUS
  Filled 2018-06-27: qty 17

## 2018-06-30 ENCOUNTER — Encounter: Payer: Self-pay | Admitting: Oncology

## 2018-07-03 ENCOUNTER — Ambulatory Visit (INDEPENDENT_AMBULATORY_CARE_PROVIDER_SITE_OTHER): Payer: PRIVATE HEALTH INSURANCE | Admitting: Family Medicine

## 2018-07-03 ENCOUNTER — Encounter: Payer: Self-pay | Admitting: Family Medicine

## 2018-07-03 ENCOUNTER — Other Ambulatory Visit: Payer: Self-pay

## 2018-07-03 DIAGNOSIS — Z9884 Bariatric surgery status: Secondary | ICD-10-CM

## 2018-07-03 DIAGNOSIS — R202 Paresthesia of skin: Secondary | ICD-10-CM

## 2018-07-03 DIAGNOSIS — E538 Deficiency of other specified B group vitamins: Secondary | ICD-10-CM

## 2018-07-03 DIAGNOSIS — R413 Other amnesia: Secondary | ICD-10-CM | POA: Diagnosis not present

## 2018-07-03 DIAGNOSIS — R200 Anesthesia of skin: Secondary | ICD-10-CM

## 2018-07-03 NOTE — Progress Notes (Signed)
Virtual Visit via Telephone The purpose of this virtual visit is to provide medical care while limiting exposure to the novel coronavirus (COVID19) for both patient and office staff.  Consent was obtained for phone visit:  Yes.   Answered questions that patient had about telehealth interaction:  Yes.   I discussed the limitations, risks, security and privacy concerns of performing an evaluation and management service by telephone. I also discussed with the patient that there may be a patient responsible charge related to this service. The patient expressed understanding and agreed to proceed.  Patient Location: Home Provider Location: [facility] (Office)  PCP is Wilhelmina McardleLauren Kennedy, AGPCNP-BC - I am currently covering during her maternity leave.   ---------------------------------------------------------------------- Chief Complaint  Patient presents with  . Memory Loss  . Numbness    persistent tingling in bilateral hands.  x >6 mths. Pt currently followed by Neurology and Hematology for Iron Infusions     S: Reviewed CMA documentation. 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 Seems to have limited response to B12 and iron treatment, she was asked  last by Heme now to consult her previous GI to discuss possible source of GI bleeding contributing to her anemia. She is scheduled next week on 6/9 with [facility]  - Regarding neurological concerns, [facility], for same complaint with bilateral hand and feet numbness >1 year, bilateral, she has had EMG ordered and MRI brain to evaluate for possible MS, and continued on Vitamin B12 injections, and offered Gabapentin as possibility. I reviewed the EMG testing in 01/2018 of lower extremity but I did not see upper extremity. She had MRI unremarkable, and then did not follow back up with Neurology due to coronavirus pandemic.  Currently, reports that symptoms of bilateral hand numbness that was episodic, started worsening now 2 weeks ago seems more constant - She was being treated with IV iron, B12, limited benefit - Additional now more severe memory concerns and she says forgetful, she found her purse in the microwave at one point - History of gastric bypass surgery years ago  Denies any fevers, chills, sweats, body ache, cough, shortness of breath, sinus pain or pressure, headache, abdominal pain, diarrhea  Past Medical History:  Diagnosis Date  . Anemia   . Anxiety   . Chronic headaches   . Depression   . Quit smoking 06/29/2016   Social History   Tobacco Use  . Smoking status: Former Smoker    Packs/day: 1.00    Years: 15.00    Pack years: 15.00    Last attempt to quit: 2018    Years since quitting: 2.4  . Smokeless tobacco: Former Engineer, waterUser  Substance Use Topics  . Alcohol use: Yes  Alcohol/week: 0.0 standard drinks  . Drug use: No    Current Outpatient Medications:  .  cholecalciferol (VITAMIN D3) 25 MCG (1000 UT) tablet, Take 2 tablets (2,000 Units total) by mouth daily., Disp: 60 tablet, Rfl: 3  Depression screen [facility] 2/9 07/03/2018 12/05/2017 07/26/2016  Decreased Interest Down, Depressed, Hopeless PHQ - 2 Score Altered sleeping  Tired, decreased energy Change in appetite 3 0 0  Feeling bad or failure about yourself  Trouble concentrating Moving slowly or fidgety/restless 3 0 0  Suicidal thoughts 0 0 0  PHQ-9 Score Difficult doing work/chores Somewhat difficult Not difficult at all Not difficult at all    No flowsheet data found.  -------------------------------------------------------------------------- O: No physical exam performed due to remote telephone encounter.  Lab results reviewed.  Recent Results (from the past 2160 hour(s))  VITAMIN D 25 Hydroxy (Vit-D Deficiency, Fractures)     Status: Abnormal   Collection Time: 06/18/18  1:37 PM  Result Value Ref Range   Vit D, 25-Hydroxy 29.1 (L) 30.0 - 100.0 ng/mL    Comment: (NOTE) Vitamin D deficiency has been defined by the Institute of Medicine and an Endocrine Society practice guideline as a level of serum 25-OH vitamin D less than 20 ng/mL (1,2). The Endocrine Society went on to further define vitamin D insufficiency as a level between 21 and 29 ng/mL (2). 1. IOM (Institute of Medicine). 2010. Dietary reference   intakes for calcium and D. [place]: The   [employer]. 2. Holick MF, Binkley Ingham, Bischoff-Ferrari HA, et al.   Evaluation, treatment, and prevention of vitamin D   deficiency: an Endocrine Society clinical practice   guideline. JCEM. 2011 Jul; 96(7):1911-30. Performed At: [facility] [address] [place], [place] [id number] Jolene Schimke MD WU:[id number]   Iron and TIBC     Status: None   Collection Time: 06/18/18  1:37 PM  Result Value Ref Range   Iron 49 28 - 170 ug/dL   TIBC 956 213 - 086 ug/dL   Saturation Ratios 12 10.4 - 31.8 %   UIBC 349 ug/dL    Comment: Performed at [facility], [address] [place], [place] [address]  Ferritin     Status: Abnormal   Collection Time: 06/18/18  1:37 PM  Result Value Ref Range   Ferritin 5 (L) 11 - 307  ng/mL    Comment: Performed at [facility], [address] [place], [place] [address]  CBC with Differential/Platelet     Status: None   Collection Time: 06/18/18  1:37 PM  Result Value Ref Range   WBC 7.6 4.0 - 10.5 K/uL   RBC 4.71 3.87 - 5.11 MIL/uL   Hemoglobin 13.6 12.0 - 15.0 g/dL   HCT 28.4 13.2 - 44.0 %   MCV 89.6 80.0 - 100.0 fL   MCH 28.9 26.0 - 34.0 pg   MCHC 32.2 30.0 - 36.0 g/dL   RDW 10.2 72.5 - 36.6 %   Platelets 162 150 - 400 K/uL   nRBC 0.0 0.0 - 0.2 %   Neutrophils Relative % 56 %   Neutro Abs 4.3 1.7 - 7.7 K/uL   Lymphocytes Relative 33 %   Lymphs Abs 2.5 0.7 - 4.0 K/uL   Monocytes Relative 6 %   Monocytes Absolute 0.4 0.1 -  1.0 K/uL   Eosinophils Relative 4 %   Eosinophils Absolute 0.3 0.0 - 0.5 K/uL   Basophils Relative 1 %   Basophils Absolute 0.0 0.0 - 0.1 K/uL   Immature Granulocytes 0 %   Abs Immature Granulocytes 0.02 0.00 - 0.07 K/uL    Comment: Performed at [facility], [address] [place], [place] [address]  Vitamin B12     Status: Abnormal   Collection Time: 06/18/18  1:37 PM  Result Value Ref Range   Vitamin B-12 107 (L) 180 - 914 pg/mL    Comment: (NOTE) This assay is not validated for testing neonatal or myeloproliferative syndrome specimens for Vitamin B12 levels. Performed at [facility], 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 - Followed by [facility] previously for initial eval and testing, was unable to follow back up with them  due to coronavirus pandemic and lack of follow-up  - MRI brain negative, no evidence of MS - RLE EMG negative, no result available for UE - Not on medication for this, considered gabapentin - Advised patient today that this is a complex situation that still seems likely to be either neurological or deficiency related, and she is already on maximal treatment for vitamin b12 deficiency, complicated by her GI history - Next step is that she needs to return to [facility] and discuss all test results and determine next step in treatment plan. She needs to update them on current situation with her Vitamin B12 as well and describe worsening symptoms numbness and now memory loss. - I will also follow-up with her specialist and send a message to [facility] to share these updates as best we can, to encourage prompt follow-up  Heme Regarding her anemia and vitamin deficiency She continues with Dr Cathie Hoops and [facility] with iv iron infusions and vitamin B12 injections, seems limited benefit, now worsening Vitamin B12 - Next plan was for her to have GI evaluation to rule out GIB as possibility of trigger symptoms - Pending GI next week - Advised her to update [facility]ology on other updates - I will send a staff message to Dr Cathie Hoops to review, share my concerns about patient's limited progress  Ultimately in future, advised patient that we are concerned about her symptoms, and eventually if she continues to exhaust all options currently, she may be candidate for 2nd opinion and we can consider referral to other location in future if she would like to review her case as well.   No orders of the defined types were placed in this encounter.   Follow-up: - Return in 6 wk to 3 months for Updates - Heme/GI/Neuro, numbness/memory loss  Patient verbalizes understanding with the above medical recommendations including the limitation of remote medical advice.  Specific follow-up and call-back criteria were given  for patient to follow-up or seek medical care more urgently if needed.   - Time spent in direct consultation with patient on phone:  18 minutes  Saralyn Pilar, DO [facility] 07/03/2018, 8:45 AM

## 2018-07-03 NOTE — Patient Instructions (Addendum)
I am very concerned about your symptoms - but I am not sure what else to offer you treatment or testing wise that your specialists have not tried already.  Let's get updates from all of them once more - before determining next step and even considering referral to 2nd opinion at other location such as [facility] or [facility].  Please follow up with GI specialist next week to determine if any source of bleeding AND / OR any other reason for your vitamin deficiency - I think the gastric bypass surgery is a primary issue here unfortunately it is making it more complicated with the absorption.  I will reach out to Dr Cathie Hoops and also [facility] to notify them of the worsening symptoms, and my concerns, hopefully we can keep everyone on the same page and let you know if any new updates or treatment recommendations.  Please schedule a Follow-up Appointment to: Return in about 6 weeks (around 08/14/2018), or if symptoms worsen or fail to improve, for Updates - Heme/GI/Neuro, numbness/memory loss.  If you have any other questions or concerns, please feel free to call the office or send a message through MyChart. You may also schedule an earlier appointment if necessary.  Additionally, you may be receiving a survey about your experience at our office within a few days to 1 week by e-mail or mail. We value your feedback.  Saralyn Pilar, DO [facility], [place]

## 2018-07-04 ENCOUNTER — Other Ambulatory Visit: Payer: Self-pay

## 2018-07-04 ENCOUNTER — Inpatient Hospital Stay: Payer: PRIVATE HEALTH INSURANCE | Attending: Oncology

## 2018-07-04 VITALS — BP 102/71 | HR 61 | Temp 97.0°F | Resp 19

## 2018-07-04 DIAGNOSIS — D509 Iron deficiency anemia, unspecified: Secondary | ICD-10-CM | POA: Diagnosis not present

## 2018-07-04 MED ORDER — SODIUM CHLORIDE 0.9 % IV SOLN
Freq: Once | INTRAVENOUS | Status: AC
  Administered 2018-07-04: 14:00:00 via INTRAVENOUS
  Filled 2018-07-04: qty 250

## 2018-07-04 MED ORDER — SODIUM CHLORIDE 0.9 % IV SOLN
510.0000 mg | Freq: Once | INTRAVENOUS | Status: AC
  Administered 2018-07-04: 14:00:00 510 mg via INTRAVENOUS
  Filled 2018-07-04: qty 17

## 2018-07-18 ENCOUNTER — Inpatient Hospital Stay: Payer: PRIVATE HEALTH INSURANCE | Attending: Oncology

## 2018-07-18 ENCOUNTER — Other Ambulatory Visit: Payer: Self-pay | Admitting: Oncology

## 2018-07-18 ENCOUNTER — Other Ambulatory Visit: Payer: Self-pay

## 2018-07-18 DIAGNOSIS — D509 Iron deficiency anemia, unspecified: Secondary | ICD-10-CM

## 2018-07-18 DIAGNOSIS — D519 Vitamin B12 deficiency anemia, unspecified: Secondary | ICD-10-CM | POA: Insufficient documentation

## 2018-07-18 MED ORDER — CYANOCOBALAMIN 1000 MCG/ML IJ SOLN
1000.0000 ug | Freq: Once | INTRAMUSCULAR | Status: AC
  Administered 2018-07-18: 14:00:00 1000 ug via INTRAMUSCULAR

## 2018-07-21 ENCOUNTER — Inpatient Hospital Stay: Payer: PRIVATE HEALTH INSURANCE

## 2018-07-23 LAB — HM COLONOSCOPY

## 2018-08-18 ENCOUNTER — Other Ambulatory Visit: Payer: Self-pay

## 2018-08-18 ENCOUNTER — Inpatient Hospital Stay: Payer: PRIVATE HEALTH INSURANCE | Attending: Oncology

## 2018-08-18 DIAGNOSIS — E538 Deficiency of other specified B group vitamins: Secondary | ICD-10-CM | POA: Diagnosis not present

## 2018-08-18 DIAGNOSIS — D509 Iron deficiency anemia, unspecified: Secondary | ICD-10-CM

## 2018-08-18 MED ORDER — CYANOCOBALAMIN 1000 MCG/ML IJ SOLN
1000.0000 ug | Freq: Once | INTRAMUSCULAR | Status: AC
  Administered 2018-08-18: 1000 ug via INTRAMUSCULAR
  Filled 2018-08-18: qty 1

## 2018-09-18 ENCOUNTER — Inpatient Hospital Stay: Payer: PRIVATE HEALTH INSURANCE | Attending: Oncology

## 2018-09-18 ENCOUNTER — Other Ambulatory Visit: Payer: Self-pay

## 2018-09-18 DIAGNOSIS — D509 Iron deficiency anemia, unspecified: Secondary | ICD-10-CM | POA: Diagnosis present

## 2018-09-18 DIAGNOSIS — D518 Other vitamin B12 deficiency anemias: Secondary | ICD-10-CM

## 2018-09-18 DIAGNOSIS — E538 Deficiency of other specified B group vitamins: Secondary | ICD-10-CM | POA: Insufficient documentation

## 2018-09-18 DIAGNOSIS — E559 Vitamin D deficiency, unspecified: Secondary | ICD-10-CM

## 2018-09-18 LAB — COMPREHENSIVE METABOLIC PANEL
ALT: 13 U/L (ref 0–44)
AST: 16 U/L (ref 15–41)
Albumin: 4 g/dL (ref 3.5–5.0)
Alkaline Phosphatase: 60 U/L (ref 38–126)
Anion gap: 7 (ref 5–15)
BUN: 10 mg/dL (ref 6–20)
CO2: 25 mmol/L (ref 22–32)
Calcium: 8.8 mg/dL — ABNORMAL LOW (ref 8.9–10.3)
Chloride: 105 mmol/L (ref 98–111)
Creatinine, Ser: 0.64 mg/dL (ref 0.44–1.00)
GFR calc Af Amer: >60 mL/min (ref >60)
GFR calc non Af Amer: >60 mL/min (ref >60)
Glucose, Bld: 104 mg/dL — ABNORMAL HIGH (ref 70–99)
Potassium: 4.2 mmol/L (ref 3.5–5.1)
Sodium: 137 mmol/L (ref 135–145)
Total Bilirubin: 0.8 mg/dL (ref 0.3–1.2)
Total Protein: 6.7 g/dL (ref 6.5–8.1)

## 2018-09-18 LAB — CBC WITH DIFFERENTIAL/PLATELET
Abs Immature Granulocytes: 0.02 10*3/uL (ref 0.00–0.07)
Basophils Absolute: 0 10*3/uL (ref 0.0–0.1)
Basophils Relative: 1 %
Eosinophils Absolute: 0.1 10*3/uL (ref 0.0–0.5)
Eosinophils Relative: 1 %
HCT: 41.9 % (ref 36.0–46.0)
Hemoglobin: 14.2 g/dL (ref 12.0–15.0)
Immature Granulocytes: 0 %
Lymphocytes Relative: 40 %
Lymphs Abs: 2.5 10*3/uL (ref 0.7–4.0)
MCH: 30.2 pg (ref 26.0–34.0)
MCHC: 33.9 g/dL (ref 30.0–36.0)
MCV: 89.1 fL (ref 80.0–100.0)
Monocytes Absolute: 0.4 10*3/uL (ref 0.1–1.0)
Monocytes Relative: 7 %
Neutro Abs: 3.2 10*3/uL (ref 1.7–7.7)
Neutrophils Relative %: 51 %
Platelets: 134 10*3/uL — ABNORMAL LOW (ref 150–400)
RBC: 4.7 MIL/uL (ref 3.87–5.11)
RDW: 13 % (ref 11.5–15.5)
WBC: 6.3 10*3/uL (ref 4.0–10.5)
nRBC: 0 % (ref 0.0–0.2)

## 2018-09-18 LAB — IRON AND TIBC
Iron: 155 ug/dL (ref 28–170)
Saturation Ratios: 50 % — ABNORMAL HIGH (ref 10.4–31.8)
TIBC: 313 ug/dL (ref 250–450)
UIBC: 158 ug/dL

## 2018-09-18 LAB — FERRITIN: Ferritin: 46 ng/mL (ref 11–307)

## 2018-09-18 LAB — VITAMIN B12: Vitamin B-12: 112 pg/mL — ABNORMAL LOW (ref 180–914)

## 2018-09-19 ENCOUNTER — Inpatient Hospital Stay: Payer: PRIVATE HEALTH INSURANCE

## 2018-09-19 ENCOUNTER — Other Ambulatory Visit: Payer: Self-pay | Admitting: Oncology

## 2018-09-19 LAB — VITAMIN D 25 HYDROXY (VIT D DEFICIENCY, FRACTURES): Vit D, 25-Hydroxy: 34.1 ng/mL (ref 30.0–100.0)

## 2018-09-22 ENCOUNTER — Inpatient Hospital Stay: Payer: PRIVATE HEALTH INSURANCE | Admitting: Oncology

## 2018-09-22 ENCOUNTER — Encounter: Payer: Self-pay | Admitting: Oncology

## 2018-09-22 ENCOUNTER — Inpatient Hospital Stay (HOSPITAL_BASED_OUTPATIENT_CLINIC_OR_DEPARTMENT_OTHER): Payer: PRIVATE HEALTH INSURANCE | Admitting: Oncology

## 2018-09-22 ENCOUNTER — Inpatient Hospital Stay: Payer: PRIVATE HEALTH INSURANCE

## 2018-09-22 ENCOUNTER — Other Ambulatory Visit: Payer: Self-pay

## 2018-09-22 VITALS — BP 129/83 | HR 73 | Temp 97.2°F | Resp 18 | Wt 191.8 lb

## 2018-09-22 DIAGNOSIS — E559 Vitamin D deficiency, unspecified: Secondary | ICD-10-CM | POA: Diagnosis not present

## 2018-09-22 DIAGNOSIS — D509 Iron deficiency anemia, unspecified: Secondary | ICD-10-CM

## 2018-09-22 DIAGNOSIS — D518 Other vitamin B12 deficiency anemias: Secondary | ICD-10-CM | POA: Diagnosis not present

## 2018-09-22 DIAGNOSIS — E538 Deficiency of other specified B group vitamins: Secondary | ICD-10-CM | POA: Diagnosis not present

## 2018-09-22 DIAGNOSIS — G629 Polyneuropathy, unspecified: Secondary | ICD-10-CM

## 2018-09-22 DIAGNOSIS — Z9884 Bariatric surgery status: Secondary | ICD-10-CM | POA: Diagnosis not present

## 2018-09-22 MED ORDER — CYANOCOBALAMIN 1000 MCG/ML IJ SOLN
1000.0000 ug | Freq: Once | INTRAMUSCULAR | Status: AC
  Administered 2018-09-22: 13:00:00 1000 ug via INTRAMUSCULAR
  Filled 2018-09-22: qty 1

## 2018-09-22 NOTE — Progress Notes (Signed)
Hematology/Oncology follow up  note [facility] Telephone:(336) [phone] [phone]) [phone]   Patient Care Team: Galen Manila, NP as PCP - General (Nurse Practitioner)  REFERRING PROVIDER: Galen Manila, NP  CHIEF COMPLAINTS/REASON FOR VISIT:  Evaluation of iron deficiency anemia  HISTORY OF PRESENTING ILLNESS:  [name] is a  43 y.o.  female with PMH listed below who was referred to me for evaluation of iron deficiency anemia Reviewed patient's recent labs that was done at [facility] office. 12/06/2017 Labs revealed anemia with hemoglobin of 9.6, mcv 69.9.  Iron panel showed ferritin of 2, iron saturation 2, TIBC 446.  Folate 7.2, vitamin b12 112 Associated signs and symptoms: Patient reports fatigue.  Mild SOB with exertion.  Denies weight loss, easy bruising, hematochezia, hemoptysis, hematuria. 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   INTERVAL HISTORY [name] is a 43 y.o. 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  #Patient has been on parenteral vitamin B12 injections monthly. Reports that her numbness tingling of her fingertips and foot are getting worse. She has establish care with neurologist and was seen by Dr. Sharman Crate on 01/14/2018. Patient has had EMG of upper extremity and the lower extremity as well as MRI brain with and without contrast for evaluation of MS.  Patient has a family history of MS.  Patient is in tears, she feels that she has seen multiple doctors without her symptoms  being relieved.  She says " I have not get any call back from neurology regarding what to do when to follow-up.".  She was called and was told that her MRI results are fine she is not taking anything for neuropathy.  She also feels " low sugar symptoms" during the night lately.  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  She feels she knows about symptoms of low sugar.  She has not checked her sugar lately.  Her weight has been stable since January 2020.  Weights 191 pounds in the clinic today.  Review of Systems  Constitutional: Positive for malaise/fatigue. Negative for chills, fever and weight loss.  HENT: Negative for sore throat.   Eyes: Negative for redness.  Respiratory: Negative for cough, shortness of breath and wheezing.   Cardiovascular: Negative for chest pain, palpitations and leg swelling.  Gastrointestinal: Negative for abdominal pain, blood in stool, nausea and vomiting.  Genitourinary: Negative for dysuria.  Musculoskeletal: Negative for myalgias.  Skin: Negative for rash.  Neurological: Positive for sensory change. Negative for dizziness, tingling and tremors.  Endo/Heme/Allergies: Does not bruise/bleed easily.  Psychiatric/Behavioral: Positive for depression. Negative for hallucinations.    MEDICAL HISTORY:  Past Medical History:  Diagnosis Date  . Anemia   . Anxiety   . Chronic headaches   . Depression   . Quit smoking 06/29/2016    SURGICAL HISTORY: Past Surgical History:  Procedure Laterality Date  . ABDOMINAL HYSTERECTOMY     partial  . bartholins cyst removal  2017  . BREAST BIOPSY Right 2008   CORE - NEG  . BREAST EXCISIONAL BIOPSY Right 2010   NEG  .  CESAREAN SECTION    . GALLBLADDER SURGERY  2012  . GASTRIC BYPASS  2012  . TUBAL LIGATION    . VAGINAL HYSTERECTOMY  2006   ovaries remain    SOCIAL HISTORY: Social History   Socioeconomic History  . Marital status: Widowed    Spouse name: Not on file  . Number of  children: Not on file  . Years of education: Not on file  . Highest education level: Not on file  Occupational History  . Not on file  Social Needs  . Financial resource strain: Not very hard  . Food insecurity    Worry: Sometimes true    Inability: Sometimes true  . Transportation needs    Medical: No    Non-medical: No  Tobacco Use  . Smoking status: Former Smoker    Packs/day: 1.00    Years: 15.00    Pack years: 15.00    Quit date: 2018    Years since quitting: 2.6  . Smokeless tobacco: Former Network engineer and Sexual Activity  . Alcohol use: Yes    Alcohol/week: 0.0 standard drinks  . Drug use: No  . Sexual activity: Yes    Birth control/protection: None  Lifestyle  . Physical activity    Days per week: 3 days    Minutes per session: 60 min  . Stress: Not on file  Relationships  . Social Herbalist on phone: Not on file    Gets together: Not on file    Attends religious service: Not on file    Active member of club or organization: Not on file    Attends meetings of clubs or organizations: Not on file    Relationship status: Not on file  . Intimate partner violence    Fear of current or ex partner: No    Emotionally abused: No    Physically abused: No    Forced sexual activity: No  Other Topics Concern  . Not on file  Social History Narrative  . Not on file    FAMILY HISTORY: Family History  Problem Relation Age of Onset  . Breast cancer Mother        22'S  . Breast cancer Maternal Grandmother        60'S  . Hypothyroidism Father   . Multiple sclerosis Maternal Aunt   . Multiple sclerosis Maternal Aunt   . Multiple sclerosis Cousin     ALLERGIES:  is allergic to morphine.  MEDICATIONS:  Current Outpatient Medications  Medication Sig Dispense Refill  . cholecalciferol (VITAMIN D3) 25 MCG (1000 UT) tablet Take 2 tablets (2,000 Units total) by mouth daily. 60 tablet 3  . ibuprofen (ADVIL) 600 MG tablet Take by mouth.     No current  facility-administered medications for this visit.      PHYSICAL EXAMINATION: ECOG PERFORMANCE STATUS: 1 - Symptomatic but completely ambulatory Vitals:   09/22/18 1259  BP: 129/83  Pulse: 73  Resp: 18  Temp: (!) 97.2 F (36.2 C)   Filed Weights   09/22/18 1259  Weight: 191 lb 12.8 oz (87 kg)    Physical Exam Constitutional:      General: She is not in acute distress. HENT:     Head: Normocephalic and atraumatic.  Eyes:     General: No scleral icterus.    Pupils: Pupils are equal, round, and reactive to light.  Neck:     Musculoskeletal: Normal range of motion and neck supple.  Cardiovascular:  Rate and Rhythm: Normal rate and regular rhythm.     Heart sounds: Normal heart sounds.  Pulmonary:     Effort: Pulmonary effort is normal. No respiratory distress.     Breath sounds: No wheezing.  Abdominal:     General: Bowel sounds are normal. There is no distension.     Palpations: Abdomen is soft. There is no mass.     Tenderness: There is no abdominal tenderness.  Musculoskeletal: Normal range of motion.        General: No deformity.  Skin:    General: Skin is warm and dry.     Findings: No erythema or rash.  Neurological:     Mental Status: She is alert and oriented to person, place, and time.     Cranial Nerves: No cranial nerve deficit.     Coordination: Coordination normal.  Psychiatric:     Comments: Depressed.  In tears.  Reports being frustrated recently.  Denies any suicidal ideation.      LABORATORY DATA:  I have reviewed the data as listed Lab Results  Component Value Date   WBC 6.3 09/18/2018   HGB 14.2 09/18/2018   HCT 41.9 09/18/2018   MCV 89.1 09/18/2018   PLT 134 (L) 09/18/2018   Recent Labs    12/06/17 0813 09/18/18 1101  NA 137 137  K 4.7 4.2  CL 103 105  CO2 27 25  GLUCOSE 79 104*  BUN 8 10  CREATININE 0.70 0.64  CALCIUM 8.8 8.8*  GFRNONAA 108 >60  GFRAA 126 >60  PROT 5.9* 6.7  ALBUMIN  --  4.0  AST 15 16  ALT 10 13   ALKPHOS  --  60  BILITOT 0.4 0.8   Iron/TIBC/Ferritin/ %Sat    Component Value Date/Time   IRON 155 09/18/2018 1101   TIBC 313 09/18/2018 1101   FERRITIN 46 09/18/2018 1101   IRONPCTSAT 50 (H) 09/18/2018 1101   IRONPCTSAT 2 (L) 12/06/2017 0818        ASSESSMENT & PLAN:  1. Iron deficiency anemia, unspecified iron deficiency anemia type   2. Vitamin B12 deficiency (dietary) anemia   3. Vitamin D deficiency   4. Personal history of gastric bypass   5. Neuropathy     #Labs are reviewed and discussed with patient. Iron panel and CBC showed stable hemoglobin and iron.  No need for IV Feraheme today.  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 Patient has been on monthly parenteral vitamin B12 injections, B12 is still low. Recommend to restart vitamin B12 daily injections x5, followed by weekly vitamin B12 injections. Trend vitamin B12 levels.  I would check MMA and homocysteine levels to confirm if she has B12 deficiencies..  . #Neuropathy/numbness tingling of fingertips and foot Patient has establish care with neurology.  Has had EMG and MRI done. Recommend patient to follow-up with neurology and discuss if any medication can be started for symptom control. Patient expresses her frustrations of not being given an explanation or a follow-up appointment after seeing neurology. Also encourage patient to discuss with PCP as well.  #Vitamin D deficiency: Vitamin D level 34.1.  Improved.  Advised patient to continue take daily vitamin D 2000 units daily.  Orders Placed This Encounter  Procedures  . CBC with Differential/Platelet    Standing Status:   Future    Standing Expiration Date:   09/22/2019  . Ferritin    Standing Status:   Future    Standing Expiration Date:  09/22/2019  . Iron and TIBC    Standing Status:   Future    Standing Expiration Date:   09/22/2019  . Vitamin B12    Standing Status:   Future    Standing Expiration  Date:   01/22/2019  . Comprehensive metabolic panel    Standing Status:   Future    Standing Expiration Date:   09/22/2019    All questions were answered. The patient knows to call the clinic with any problems questions or concerns.  Return of visit: 3 months   Rickard PatienceZhou Kyston Gonce, MD, PhD Hematology Oncology [facility] Pager- [phone] 09/22/2018

## 2018-09-22 NOTE — Progress Notes (Signed)
Patient reports that the tingling in her fingertips is getting worse.  Also always feeling tired and no energy.

## 2018-09-23 ENCOUNTER — Other Ambulatory Visit: Payer: Self-pay

## 2018-09-23 ENCOUNTER — Inpatient Hospital Stay: Payer: PRIVATE HEALTH INSURANCE

## 2018-09-23 DIAGNOSIS — E538 Deficiency of other specified B group vitamins: Secondary | ICD-10-CM | POA: Diagnosis not present

## 2018-09-23 DIAGNOSIS — D509 Iron deficiency anemia, unspecified: Secondary | ICD-10-CM

## 2018-09-23 MED ORDER — CYANOCOBALAMIN 1000 MCG/ML IJ SOLN
1000.0000 ug | Freq: Once | INTRAMUSCULAR | Status: AC
  Administered 2018-09-23: 14:00:00 1000 ug via INTRAMUSCULAR

## 2018-09-24 ENCOUNTER — Inpatient Hospital Stay: Payer: PRIVATE HEALTH INSURANCE

## 2018-09-24 ENCOUNTER — Other Ambulatory Visit: Payer: Self-pay

## 2018-09-24 DIAGNOSIS — E538 Deficiency of other specified B group vitamins: Secondary | ICD-10-CM | POA: Diagnosis not present

## 2018-09-24 DIAGNOSIS — D509 Iron deficiency anemia, unspecified: Secondary | ICD-10-CM

## 2018-09-24 MED ORDER — CYANOCOBALAMIN 1000 MCG/ML IJ SOLN
1000.0000 ug | Freq: Once | INTRAMUSCULAR | Status: AC
  Administered 2018-09-24: 13:00:00 1000 ug via INTRAMUSCULAR
  Filled 2018-09-24: qty 1

## 2018-09-25 ENCOUNTER — Other Ambulatory Visit: Payer: Self-pay

## 2018-09-25 ENCOUNTER — Inpatient Hospital Stay: Payer: PRIVATE HEALTH INSURANCE

## 2018-09-25 DIAGNOSIS — D509 Iron deficiency anemia, unspecified: Secondary | ICD-10-CM

## 2018-09-25 DIAGNOSIS — E538 Deficiency of other specified B group vitamins: Secondary | ICD-10-CM | POA: Diagnosis not present

## 2018-09-25 MED ORDER — CYANOCOBALAMIN 1000 MCG/ML IJ SOLN
1000.0000 ug | Freq: Once | INTRAMUSCULAR | Status: AC
  Administered 2018-09-25: 13:00:00 1000 ug via INTRAMUSCULAR
  Filled 2018-09-25: qty 1

## 2018-09-26 ENCOUNTER — Other Ambulatory Visit: Payer: Self-pay

## 2018-09-26 ENCOUNTER — Inpatient Hospital Stay: Payer: PRIVATE HEALTH INSURANCE

## 2018-09-26 DIAGNOSIS — D509 Iron deficiency anemia, unspecified: Secondary | ICD-10-CM

## 2018-09-26 DIAGNOSIS — E538 Deficiency of other specified B group vitamins: Secondary | ICD-10-CM | POA: Diagnosis not present

## 2018-09-26 MED ORDER — CYANOCOBALAMIN 1000 MCG/ML IJ SOLN
1000.0000 ug | Freq: Once | INTRAMUSCULAR | Status: AC
  Administered 2018-09-26: 13:00:00 1000 ug via INTRAMUSCULAR
  Filled 2018-09-26: qty 1

## 2018-10-03 ENCOUNTER — Other Ambulatory Visit: Payer: Self-pay

## 2018-10-07 ENCOUNTER — Inpatient Hospital Stay: Payer: PRIVATE HEALTH INSURANCE | Attending: Oncology

## 2018-10-07 ENCOUNTER — Other Ambulatory Visit: Payer: Self-pay

## 2018-10-07 DIAGNOSIS — E538 Deficiency of other specified B group vitamins: Secondary | ICD-10-CM | POA: Diagnosis not present

## 2018-10-07 DIAGNOSIS — D509 Iron deficiency anemia, unspecified: Secondary | ICD-10-CM

## 2018-10-07 MED ORDER — CYANOCOBALAMIN 1000 MCG/ML IJ SOLN
1000.0000 ug | Freq: Once | INTRAMUSCULAR | Status: AC
  Administered 2018-10-07: 1000 ug via INTRAMUSCULAR
  Filled 2018-10-07: qty 1

## 2018-10-20 ENCOUNTER — Inpatient Hospital Stay: Payer: PRIVATE HEALTH INSURANCE

## 2018-10-20 ENCOUNTER — Other Ambulatory Visit: Payer: Self-pay

## 2018-10-20 DIAGNOSIS — E538 Deficiency of other specified B group vitamins: Secondary | ICD-10-CM | POA: Diagnosis not present

## 2018-10-20 DIAGNOSIS — D509 Iron deficiency anemia, unspecified: Secondary | ICD-10-CM

## 2018-10-20 MED ORDER — CYANOCOBALAMIN 1000 MCG/ML IJ SOLN
1000.0000 ug | Freq: Once | INTRAMUSCULAR | Status: AC
  Administered 2018-10-20: 1000 ug via INTRAMUSCULAR
  Filled 2018-10-20: qty 1

## 2018-10-24 ENCOUNTER — Other Ambulatory Visit: Payer: Self-pay

## 2018-10-27 ENCOUNTER — Inpatient Hospital Stay: Payer: PRIVATE HEALTH INSURANCE

## 2018-10-27 ENCOUNTER — Other Ambulatory Visit: Payer: Self-pay

## 2018-10-27 DIAGNOSIS — E538 Deficiency of other specified B group vitamins: Secondary | ICD-10-CM | POA: Diagnosis not present

## 2018-10-27 DIAGNOSIS — D509 Iron deficiency anemia, unspecified: Secondary | ICD-10-CM

## 2018-10-27 MED ORDER — CYANOCOBALAMIN 1000 MCG/ML IJ SOLN
1000.0000 ug | Freq: Once | INTRAMUSCULAR | Status: AC
  Administered 2018-10-27: 1000 ug via INTRAMUSCULAR

## 2018-10-27 MED ORDER — SODIUM CHLORIDE 0.9 % IV SOLN
510.0000 mg | Freq: Once | INTRAVENOUS | Status: DC
  Filled 2018-10-27: qty 17

## 2018-11-03 ENCOUNTER — Inpatient Hospital Stay: Payer: PRIVATE HEALTH INSURANCE | Attending: Oncology

## 2018-11-03 ENCOUNTER — Other Ambulatory Visit: Payer: Self-pay

## 2018-11-03 DIAGNOSIS — E538 Deficiency of other specified B group vitamins: Secondary | ICD-10-CM | POA: Insufficient documentation

## 2018-11-03 DIAGNOSIS — D509 Iron deficiency anemia, unspecified: Secondary | ICD-10-CM

## 2018-11-03 MED ORDER — CYANOCOBALAMIN 1000 MCG/ML IJ SOLN
1000.0000 ug | Freq: Once | INTRAMUSCULAR | Status: AC
  Administered 2018-11-03: 13:00:00 1000 ug via INTRAMUSCULAR

## 2018-11-25 ENCOUNTER — Other Ambulatory Visit: Payer: Self-pay | Admitting: *Deleted

## 2018-11-25 DIAGNOSIS — Z20822 Contact with and (suspected) exposure to covid-19: Secondary | ICD-10-CM

## 2018-11-26 LAB — NOVEL CORONAVIRUS, NAA: SARS-CoV-2, NAA: NOT DETECTED

## 2018-11-28 ENCOUNTER — Inpatient Hospital Stay: Payer: PRIVATE HEALTH INSURANCE

## 2018-12-08 ENCOUNTER — Encounter: Payer: PRIVATE HEALTH INSURANCE | Admitting: Nurse Practitioner

## 2018-12-09 ENCOUNTER — Other Ambulatory Visit: Payer: Self-pay

## 2018-12-09 ENCOUNTER — Encounter: Payer: Self-pay | Admitting: Family Medicine

## 2018-12-09 ENCOUNTER — Ambulatory Visit (INDEPENDENT_AMBULATORY_CARE_PROVIDER_SITE_OTHER): Payer: 59 | Admitting: Family Medicine

## 2018-12-09 VITALS — BP 138/83 | HR 62 | Temp 98.7°F | Resp 16 | Ht 66.0 in | Wt 200.4 lb

## 2018-12-09 DIAGNOSIS — R7309 Other abnormal glucose: Secondary | ICD-10-CM

## 2018-12-09 DIAGNOSIS — R300 Dysuria: Secondary | ICD-10-CM

## 2018-12-09 DIAGNOSIS — F331 Major depressive disorder, recurrent, moderate: Secondary | ICD-10-CM | POA: Insufficient documentation

## 2018-12-09 DIAGNOSIS — F411 Generalized anxiety disorder: Secondary | ICD-10-CM | POA: Diagnosis not present

## 2018-12-09 DIAGNOSIS — Z0000 Encounter for general adult medical examination without abnormal findings: Secondary | ICD-10-CM | POA: Diagnosis not present

## 2018-12-09 DIAGNOSIS — E7849 Other hyperlipidemia: Secondary | ICD-10-CM

## 2018-12-09 DIAGNOSIS — N3000 Acute cystitis without hematuria: Secondary | ICD-10-CM | POA: Diagnosis not present

## 2018-12-09 DIAGNOSIS — Z8639 Personal history of other endocrine, nutritional and metabolic disease: Secondary | ICD-10-CM | POA: Diagnosis not present

## 2018-12-09 DIAGNOSIS — F410 Panic disorder [episodic paroxysmal anxiety] without agoraphobia: Secondary | ICD-10-CM

## 2018-12-09 DIAGNOSIS — Z9884 Bariatric surgery status: Secondary | ICD-10-CM

## 2018-12-09 LAB — POCT URINALYSIS DIPSTICK
Blood, UA: NEGATIVE
Glucose, UA: NEGATIVE
Nitrite, UA: NEGATIVE
Protein, UA: POSITIVE — AB
Spec Grav, UA: 1.015 (ref 1.010–1.025)
Urobilinogen, UA: 0.2 E.U./dL
pH, UA: 7 (ref 5.0–8.0)

## 2018-12-09 MED ORDER — ESCITALOPRAM OXALATE 20 MG PO TABS: 20.0000 mg | ORAL_TABLET | Freq: Every day | ORAL | 2 refills | Status: DC

## 2018-12-09 MED ORDER — HYDROXYZINE HCL 10 MG PO TABS: 10.0000 mg | ORAL_TABLET | Freq: Three times a day (TID) | ORAL | 1 refills | Status: DC | PRN

## 2018-12-09 MED ORDER — CEPHALEXIN 500 MG PO CAPS: 500.0000 mg | ORAL_CAPSULE | Freq: Three times a day (TID) | ORAL | 0 refills | Status: DC

## 2018-12-09 NOTE — Progress Notes (Signed)
Subjective:    Patient ID: [name], female    DOB: [birth year], 43 y.o.   MRN: [id number]  [name] is a 43 y.o. female presenting on 12/09/2018 for Annual Exam  Previous PCP Cassell Smiles, AGPCNP-BC   HPI   Here for Annual Physical and Lab Review.  Wellness Obesity BMI >32 / History of Gastric Bypass She has history of obesity and insulin resistance was on insulin therapy for period of time, pursued gastric bypass about 12 years ago, has lost a lot of weight and done well, now gradually gaining back weight. She has already consulted with Nutrition in the past.  Additional complaints  Leg Swelling, bilateral Reports chronic history of bilateral leg swelling, worse with weight gain, seems improve overnight if rest and elevation, worse if seated prolonged or on feet all day feels tight at times, with some indentation in leg from sock, but no other significant abnormality Prior fluid pill limited results  Anemia, Vitamin B12 Followed by [facility] Dr Tasia Catchings. Has upcoming blood work scheduled.  Chronic Depression, recurrent moderate / Generalized Anxiety  Recent worsening with life stressors and mood. Has episodes of panic and anxiety. Was not on SSRI or other medication regularly had been in past. Interested to start medications. No current therapist or psych.  Urinary Dysuria / UTI Recent concerns with abnormal urinary symptoms, request urine test today to check for UTI. Increased frequency burning recently.    Depression screen [facility] 2/9 12/09/2018 07/03/2018 12/05/2017  Decreased Interest 3 1 2   Down, Depressed, Hopeless 3 1 2   PHQ - 2 Score 6 2 4   Altered sleeping 2 3 3   Tired, decreased energy 3 3 1   Change in appetite 3 3 0  Feeling bad or failure about yourself  2 1 2   Trouble concentrating 3 3 3   Moving slowly or fidgety/restless 0 3 0  Suicidal thoughts 1 0 0  PHQ-9 Score 20 18 13   Difficult doing work/chores Extremely dIfficult Somewhat difficult Not  difficult at all   [place]-Suicide Severity Rating Scale 1) Have you wished you were dead or wished you could go to sleep and not wake up? - Yes  2) Have you had any actual thoughts of killing yourself? - No  Skip questions 3,4, 5  6) Have you ever done anything, started to do anything, or prepared to do anything to end your life? - No   GAD 7 : Generalized Anxiety Score 12/09/2018  Nervous, Anxious, on Edge 1  Control/stop worrying 1  Worry too much - different things 1  Trouble relaxing 1  Restless 2  Easily annoyed or irritable 1  Afraid - awful might happen 1  Total GAD 7 Score 8  Anxiety Difficulty Extremely difficult     Past Medical History:  Diagnosis Date  . Anemia   . Anxiety   . Chronic headaches   . Depression   . Quit smoking 06/29/2016   Past Surgical History:  Procedure Laterality Date  . ABDOMINAL HYSTERECTOMY     partial  . bartholins cyst removal  2017  . BREAST BIOPSY Right 2008   CORE - NEG  . BREAST EXCISIONAL BIOPSY Right 2010   NEG  . CESAREAN SECTION    . GALLBLADDER SURGERY  2012  . GASTRIC BYPASS  2012  . TUBAL LIGATION    . VAGINAL HYSTERECTOMY  2006   ovaries remain   Social History   Socioeconomic History  . Marital status: Widowed  Spouse name: Not on file  . Number of children: Not on file  . Years of education: Not on file  . Highest education level: Not on file  Occupational History  . Not on file  Social Needs  . Financial resource strain: Not very hard  . Food insecurity    Worry: Sometimes true    Inability: Sometimes true  . Transportation needs    Medical: No    Non-medical: No  Tobacco Use  . Smoking status: Former Smoker    Packs/day: 1.00    Years: 15.00    Pack years: 15.00    Quit date: 2018    Years since quitting: 2.8  . Smokeless tobacco: Former Engineer, water and Sexual Activity  . Alcohol use: Yes    Alcohol/week: 0.0 standard drinks  . Drug use: No  . Sexual activity: Yes    Birth  control/protection: None  Lifestyle  . Physical activity    Days per week: 3 days    Minutes per session: 60 min  . Stress: Not on file  Relationships  . Social Musician on phone: Not on file    Gets together: Not on file    Attends religious service: Not on file    Active member of club or organization: Not on file    Attends meetings of clubs or organizations: Not on file    Relationship status: Not on file  . Intimate partner violence    Fear of current or ex partner: No    Emotionally abused: No    Physically abused: No    Forced sexual activity: No  Other Topics Concern  . Not on file  Social History Narrative  . Not on file   Family History  Problem Relation Age of Onset  . Breast cancer Mother        67'S  . Breast cancer Maternal Grandmother        60'S  . Hypothyroidism Father   . Multiple sclerosis Maternal Aunt   . Multiple sclerosis Maternal Aunt   . Multiple sclerosis Cousin    Current Outpatient Medications on File Prior to Visit  Medication Sig  . cholecalciferol (VITAMIN D3) 25 MCG (1000 UT) tablet Take 2 tablets (2,000 Units total) by mouth daily.  Marland Kitchen ibuprofen (ADVIL) 600 MG tablet Take by mouth.   No current facility-administered medications on file prior to visit.     Review of Systems Per HPI unless specifically indicated above      Objective:    BP 138/83 (BP Location: Left Arm, Patient Position: Sitting, Cuff Size: Normal)   Pulse 62   Temp 98.7 F (37.1 C) (Oral)   Resp 16   Ht 5\' 6"  (1.676 m)   Wt 200 lb 6.4 oz (90.9 kg)   LMP  (LMP Unknown)   SpO2 98%   BMI 32.35 kg/m   Wt Readings from Last 3 Encounters:  12/09/18 200 lb 6.4 oz (90.9 kg)  09/22/18 191 lb 12.8 oz (87 kg)  02/14/18 191 lb 9 oz (86.9 kg)    Physical Exam     Results for orders placed or performed in visit on 12/09/18  [facility] - POCT UA urinalysis dipstick  Result Value Ref Range   Color, UA Clear    Clarity, UA Clear    Glucose, UA Negative  Negative   Bilirubin, UA Mod(++)    Ketones, UA 5(tr)    Spec Grav, UA 1.015 1.010 - 1.025  Blood, UA Negative    pH, UA 7.0 5.0 - 8.0   Protein, UA Positive (A) Negative   Urobilinogen, UA 0.2 0.2 or 1.0 E.U./dL   Nitrite, UA Neg    Leukocytes, UA Moderate (2+) (A) Negative   Appearance     Odor        Assessment & Plan:   Problem List Items Addressed This Visit    Major depressive disorder, recurrent, moderate (HCC)   Relevant Medications   escitalopram (LEXAPRO) 20 MG tablet   hydrOXYzine (ATARAX/VISTARIL) 10 MG tablet   Generalized anxiety disorder with panic attacks   Relevant Medications   escitalopram (LEXAPRO) 20 MG tablet   hydrOXYzine (ATARAX/VISTARIL) 10 MG tablet    Other Visit Diagnoses    Annual physical exam    -  Primary   History of insulin resistance       Dysuria       Relevant Orders   [facility] - POCT UA urinalysis dipstick (Completed)   [facility] - Urine culture   Urinary frequency       Relevant Orders   [facility] - POCT UA urinalysis dipstick (Completed)   [facility] - Urine culture      #Depression/Anxiety Worsening mood/anxiety, with panic Start Escitalopram 20mg  (start half pill 10mg  daily for 2-4 weeks) then titrate up to 20mg  daily if tolerated Add Hydroxyzine PRN for panic, caution sedation Handout on refer to psych/therapy, can do refer or self referral Follow-up  #UTI Clinically consistent additionally after review of urine dipstick, called patient, advised results, proceed with Keflex antibiotic 500 TID x 7 days, f/u culture result.  Updated Health Maintenance information Added labs for following day lipid and A1c Encouraged improvement to lifestyle with diet and exercise Goal of weight loss   Meds ordered this encounter  Medications  . escitalopram (LEXAPRO) 20 MG tablet    Sig: Take 1 tablet (20 mg total) by mouth daily. May cut in half for dose 10mg  for first 2-4 weeks, increase to 1 whole tablet 20mg  daily if needed    Dispense:  30 tablet     Refill:  2  . hydrOXYzine (ATARAX/VISTARIL) 10 MG tablet    Sig: Take 1 tablet (10 mg total) by mouth every 8 (eight) hours as needed for anxiety.    Dispense:  30 tablet    Refill:  1      Follow up plan: Return in about 4 weeks (around 01/06/2019) for 4-6 weeks Major Depression PHQ GAD Med adjust (Virtual Telephone).    , DO [facility] 12/09/2018, 2:16 PM

## 2018-12-09 NOTE — Patient Instructions (Addendum)
Thank you for coming to the office today.   As discussed, it sounds like your symptoms are primarily related to depression and anxiety. This is a very common problem and be related to several factors, including life stressors. Start treatment with Escitalopram (generic Lexapro), take HALF of 20mg  for dose 10mg  daily for next several weeks. As discussed most anxiety medications are also used for mood disorders such as depression, because they work on similar chemicals in your brain. It may take up to 3-4 weeks for the medicine to take full effect and for you to notice a difference, sometimes you may notice it working sooner, otherwise we may need to adjust the dose.  Added Hydroxyzine, as needed for acute panic attack if you develop symptoms, can have calming sedating effect, caution with this. Use as needed only and can help sleep.  Urine test ordered, stay tuned for results, will call you or send on mychart if identify infection UTI   Recommend therapist as well - see if any of these options will work for you  -----------------------------------------------  [place] ([place] [facility]) Address: [address], [place], [place] [address] Hours: 8:30AM-5PM Phone: [phone]  Cephus Shelling, MD [place], [place] [address] Phone: [phone]   [facility] (All ages) [address] [place], [id number] Phone: [phone] (Option 1) [email or web address]   ------------------------------------- Walk in only   [facility])  [address], [place], [place] [address] Phone: [phone]  [employer], available walk-in 9am-4pm M-F [place], [place] [address] Hours: [place] (M-F, walk in available) Phone:([phone]  [phone]   ----------------------------------------------------------------- PSYCHOLOGY COUNSELING ONLY  Buena Irish, LCSW [address] [place], [place]: [phone]   Self Referral:  [place].   Address: [address], [place], [place] [address] Hours: Open today  9AM-7PM Phone: [phone]  [facility] [address], [place] Address: [address] [place], [place], [place] [address] Phone: [phone]    DUE for [place] (no food or drink after midnight before the lab appointment, only water or coffee without cream/sugar on the morning of)  SCHEDULE "Lab Only" visit in the morning at the clinic for lab draw in 1-2 days  - Make sure Lab Only appointment is at about 1 week before your next appointment, so that results will be available  For Lab Results, once available within 2-3 days of blood draw, you can can log in to MyChart online to view your results and a brief explanation. Also, we can discuss results at next follow-up visit.   Please schedule a Follow-up Appointment to: Return in about 4 weeks (around 01/06/2019) for 4-6 weeks Major Depression PHQ GAD Med adjust (Virtual Telephone).  If you have any other questions or concerns, please feel free to call the office or send a message through [place]. You may also schedule an earlier appointment if necessary.  Additionally, you may be receiving a survey about your experience at our office within a few days to 1 week by e-mail or mail. We value your feedback.  Heather Putnam, DO [place]

## 2018-12-10 ENCOUNTER — Other Ambulatory Visit: Payer: Self-pay

## 2018-12-11 ENCOUNTER — Other Ambulatory Visit: Payer: 59

## 2018-12-11 ENCOUNTER — Other Ambulatory Visit: Payer: Self-pay

## 2018-12-11 LAB — URINE CULTURE
MICRO NUMBER:: 1084346
SPECIMEN QUALITY:: ADEQUATE

## 2018-12-12 LAB — LIPID PANEL
Cholesterol: 175 mg/dL (ref <200)
HDL: 80 mg/dL (ref >=50)
LDL Cholesterol (Calc): 83 mg/dL (calc)
Non-HDL Cholesterol (Calc): 95 mg/dL (calc) (ref <130)
Total CHOL/HDL Ratio: 2.2 (calc) (ref <5.0)
Triglycerides: 43 mg/dL (ref <150)

## 2018-12-12 LAB — TSH: TSH: 1 mIU/L

## 2018-12-12 LAB — HEMOGLOBIN A1C
Hgb A1c MFr Bld: 5.2 % of total Hgb (ref <5.7)
Mean Plasma Glucose: 103 (calc)
eAG (mmol/L): 5.7 (calc)

## 2018-12-29 ENCOUNTER — Inpatient Hospital Stay: Payer: PRIVATE HEALTH INSURANCE

## 2018-12-29 ENCOUNTER — Other Ambulatory Visit: Payer: Self-pay

## 2018-12-29 ENCOUNTER — Inpatient Hospital Stay: Payer: PRIVATE HEALTH INSURANCE | Attending: Oncology | Admitting: Oncology

## 2018-12-29 ENCOUNTER — Encounter: Payer: Self-pay | Admitting: Oncology

## 2018-12-29 VITALS — BP 114/78 | HR 51 | Temp 96.6°F | Resp 16 | Wt 194.8 lb

## 2018-12-29 DIAGNOSIS — D518 Other vitamin B12 deficiency anemias: Secondary | ICD-10-CM | POA: Diagnosis not present

## 2018-12-29 DIAGNOSIS — Z8639 Personal history of other endocrine, nutritional and metabolic disease: Secondary | ICD-10-CM

## 2018-12-29 DIAGNOSIS — D509 Iron deficiency anemia, unspecified: Secondary | ICD-10-CM

## 2018-12-29 DIAGNOSIS — G629 Polyneuropathy, unspecified: Secondary | ICD-10-CM

## 2018-12-29 DIAGNOSIS — E538 Deficiency of other specified B group vitamins: Secondary | ICD-10-CM | POA: Insufficient documentation

## 2018-12-29 DIAGNOSIS — Z9884 Bariatric surgery status: Secondary | ICD-10-CM

## 2018-12-29 DIAGNOSIS — E559 Vitamin D deficiency, unspecified: Secondary | ICD-10-CM

## 2018-12-29 LAB — CBC WITH DIFFERENTIAL/PLATELET
Abs Immature Granulocytes: 0.01 10*3/uL (ref 0.00–0.07)
Basophils Absolute: 0.1 10*3/uL (ref 0.0–0.1)
Basophils Relative: 1 %
Eosinophils Absolute: 0.3 10*3/uL (ref 0.0–0.5)
Eosinophils Relative: 5 %
HCT: 44.7 % (ref 36.0–46.0)
Hemoglobin: 14.4 g/dL (ref 12.0–15.0)
Immature Granulocytes: 0 %
Lymphocytes Relative: 35 %
Lymphs Abs: 2 10*3/uL (ref 0.7–4.0)
MCH: 29.9 pg (ref 26.0–34.0)
MCHC: 32.2 g/dL (ref 30.0–36.0)
MCV: 92.7 fL (ref 80.0–100.0)
Monocytes Absolute: 0.4 10*3/uL (ref 0.1–1.0)
Monocytes Relative: 7 %
Neutro Abs: 3 10*3/uL (ref 1.7–7.7)
Neutrophils Relative %: 52 %
Platelets: 157 10*3/uL (ref 150–400)
RBC: 4.82 MIL/uL (ref 3.87–5.11)
RDW: 11.9 % (ref 11.5–15.5)
WBC: 5.8 10*3/uL (ref 4.0–10.5)
nRBC: 0 % (ref 0.0–0.2)

## 2018-12-29 LAB — IRON AND TIBC
Iron: 124 ug/dL (ref 28–170)
Saturation Ratios: 39 % — ABNORMAL HIGH (ref 10.4–31.8)
TIBC: 319 ug/dL (ref 250–450)
UIBC: 195 ug/dL

## 2018-12-29 LAB — COMPREHENSIVE METABOLIC PANEL
ALT: 12 U/L (ref 0–44)
AST: 16 U/L (ref 15–41)
Albumin: 4.1 g/dL (ref 3.5–5.0)
Alkaline Phosphatase: 55 U/L (ref 38–126)
Anion gap: 7 (ref 5–15)
BUN: 9 mg/dL (ref 6–20)
CO2: 27 mmol/L (ref 22–32)
Calcium: 8.8 mg/dL — ABNORMAL LOW (ref 8.9–10.3)
Chloride: 101 mmol/L (ref 98–111)
Creatinine, Ser: 0.77 mg/dL (ref 0.44–1.00)
GFR calc Af Amer: >60 mL/min (ref >60)
GFR calc non Af Amer: >60 mL/min (ref >60)
Glucose, Bld: 87 mg/dL (ref 70–99)
Potassium: 4.1 mmol/L (ref 3.5–5.1)
Sodium: 135 mmol/L (ref 135–145)
Total Bilirubin: 0.7 mg/dL (ref 0.3–1.2)
Total Protein: 6.6 g/dL (ref 6.5–8.1)

## 2018-12-29 LAB — VITAMIN B12: Vitamin B-12: 226 pg/mL (ref 180–914)

## 2018-12-29 LAB — FERRITIN: Ferritin: 41 ng/mL (ref 11–307)

## 2018-12-29 MED ORDER — CYANOCOBALAMIN 1000 MCG/ML IJ SOLN
1000.0000 ug | Freq: Once | INTRAMUSCULAR | Status: AC
  Administered 2018-12-29: 1000 ug via INTRAMUSCULAR
  Filled 2018-12-29: qty 1

## 2018-12-29 NOTE — Progress Notes (Signed)
Hematology/Oncology follow up  note [facility] Telephone:([phone] [phone]   Patient Care Team: Galen Manila, NP (Inactive) as PCP - General (Nurse Practitioner)  REFERRING PROVIDER: Galen Manila, NP (Inactive)  CHIEF COMPLAINTS/REASON FOR VISIT:  Evaluation of iron deficiency anemia  HISTORY OF PRESENTING ILLNESS:  [name] is a  43 y.o.  female with PMH listed below who was referred to me for evaluation of iron deficiency anemia Reviewed patient's recent labs that was done at [facility] office. 12/06/2017 Labs revealed anemia with hemoglobin of 9.6, mcv 69.9.  Iron panel showed ferritin of 2, iron saturation 2, TIBC 446.  Folate 7.2, vitamin b12 112 Associated signs and symptoms: Patient reports fatigue.  Mild SOB with exertion.  Denies weight loss, easy bruising, hematochezia, hemoptysis, hematuria. 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   INTERVAL HISTORY [name] is a 43 y.o. 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 Mood is good today. Seen by PCP on 12/09/2018 Started on lexapro and being referred to psych for evaluation.  Continue to feel fatigued, unchanged.  #Patient has been on parenteral vitamin B12 injections monthly. Continue to have neuropathy. previously seen  Dr. Sharman Crate on 01/14/2018. Patient has had EMG of upper extremity and the lower extremity as well as MRI brain with and without contrast for evaluation of MS.  Patient has a family history of MS.  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  A1c was checked recently and was 5.2  Review of Systems  Constitutional: Positive for malaise/fatigue. Negative for chills, fever and weight loss.  HENT: Negative for sore throat.   Eyes: Negative for redness.  Respiratory: Negative for cough, shortness of breath and wheezing.   Cardiovascular: Negative for chest pain, palpitations and leg swelling.  Gastrointestinal: Negative for abdominal pain, blood in stool, nausea and vomiting.  Genitourinary: Negative for dysuria.  Musculoskeletal: Negative for myalgias.  Skin: Negative for rash.  Neurological: Positive for sensory change. Negative for dizziness, tingling and tremors.  Endo/Heme/Allergies: Does not bruise/bleed easily.  Psychiatric/Behavioral: Positive for depression. Negative for hallucinations and suicidal ideas.    MEDICAL HISTORY:  Past Medical History:  Diagnosis Date  . Anemia   . Anxiety   . Chronic headaches   . Depression   . Quit smoking 06/29/2016    SURGICAL HISTORY: Past Surgical History:  Procedure Laterality Date  . ABDOMINAL HYSTERECTOMY     partial  . bartholins cyst removal  2017  . BREAST BIOPSY Right 2008   CORE - NEG  . BREAST EXCISIONAL BIOPSY Right 2010   NEG  . CESAREAN SECTION    . GALLBLADDER SURGERY  2012  . GASTRIC BYPASS  2012  . TUBAL LIGATION    . VAGINAL HYSTERECTOMY  2006   ovaries remain    SOCIAL HISTORY: Social History   Socioeconomic History  . Marital status: Widowed    Spouse name: Not on file  . Number of children: Not on file  . Years of education: Not on file  . Highest education level: Not on file  Occupational History  .  Not on file  Social Needs  . Financial resource strain: Not very hard  . Food insecurity    Worry: Sometimes true    Inability: Sometimes true  . Transportation needs    Medical: No    Non-medical: No  Tobacco Use  . Smoking status: Former Smoker    Packs/day: 1.00    Years:  15.00    Pack years: 15.00    Quit date: 2018    Years since quitting: 2.9  . Smokeless tobacco: Former Network engineer and Sexual Activity  . Alcohol use: Yes    Alcohol/week: 0.0 standard drinks  . Drug use: No  . Sexual activity: Yes    Birth control/protection: None  Lifestyle  . Physical activity    Days per week: 3 days    Minutes per session: 60 min  . Stress: Not on file  Relationships  . Social Herbalist on phone: Not on file    Gets together: Not on file    Attends religious service: Not on file    Active member of club or organization: Not on file    Attends meetings of clubs or organizations: Not on file    Relationship status: Not on file  . Intimate partner violence    Fear of current or ex partner: No    Emotionally abused: No    Physically abused: No    Forced sexual activity: No  Other Topics Concern  . Not on file  Social History Narrative  . Not on file    FAMILY HISTORY: Family History  Problem Relation Age of Onset  . Breast cancer Mother        64'S  . Breast cancer Maternal Grandmother        60'S  . Hypothyroidism Father   . Multiple sclerosis Maternal Aunt   . Multiple sclerosis Maternal Aunt   . Multiple sclerosis Cousin     ALLERGIES:  is allergic to morphine.  MEDICATIONS:  Current Outpatient Medications  Medication Sig Dispense Refill  . cholecalciferol (VITAMIN D3) 25 MCG (1000 UT) tablet Take 2 tablets (2,000 Units total) by mouth daily. 60 tablet 3  . escitalopram (LEXAPRO) 20 MG tablet Take 1 tablet (20 mg total) by mouth daily. May cut in half for dose 10mg  for first 2-4 weeks, increase to 1 whole tablet 20mg  daily if needed 30 tablet 2  . hydrOXYzine (ATARAX/VISTARIL) 10 MG tablet Take 1 tablet (10 mg total) by mouth every 8 (eight) hours as needed for anxiety. 30 tablet 1  . ibuprofen (ADVIL) 600 MG tablet Take by mouth.     No current facility-administered medications for this visit.      PHYSICAL  EXAMINATION: ECOG PERFORMANCE STATUS: 1 - Symptomatic but completely ambulatory Vitals:   12/29/18 1035  BP: 114/78  Pulse: (!) 51  Resp: 16  Temp: (!) 96.6 F (35.9 C)   Filed Weights   12/29/18 1035  Weight: 194 lb 12.8 oz (88.4 kg)    Physical Exam Constitutional:      General: She is not in acute distress. HENT:     Head: Normocephalic and atraumatic.  Eyes:     General: No scleral icterus.    Pupils: Pupils are equal, round, and reactive to light.  Neck:     Musculoskeletal: Normal range of motion and neck supple.  Cardiovascular:     Rate and Rhythm: Normal rate and regular rhythm.     Heart sounds: Normal heart sounds.  Pulmonary:     Effort: Pulmonary effort is normal. No respiratory distress.     Breath sounds: No wheezing.  Abdominal:     General: Bowel sounds are normal. There is no distension.     Palpations: Abdomen is soft. There is no mass.     Tenderness: There is no abdominal tenderness.  Musculoskeletal: Normal range of motion.        General: No deformity.  Skin:    General: Skin is warm and dry.     Findings: No erythema or rash.  Neurological:     Mental Status: She is alert and oriented to person, place, and time.     Cranial Nerves: No cranial nerve deficit.     Coordination: Coordination normal.  Psychiatric:        Mood and Affect: Mood normal.        Behavior: Behavior normal.        Thought Content: Thought content normal.      LABORATORY DATA:  I have reviewed the data as listed Lab Results  Component Value Date   WBC 5.8 12/29/2018   HGB 14.4 12/29/2018   HCT 44.7 12/29/2018   MCV 92.7 12/29/2018   PLT 157 12/29/2018   Recent Labs    09/18/18 1101 12/29/18 0957  NA 137 135  K 4.2 4.1  CL 105 101  CO2 25 27  GLUCOSE 104* 87  BUN 10 9  CREATININE 0.64 0.77  CALCIUM 8.8* 8.8*  GFRNONAA >60 >60  GFRAA >60 >60  PROT 6.7 6.6  ALBUMIN 4.0 4.1  AST 16 16  ALT 13 12  ALKPHOS 60 55  BILITOT 0.8 0.7    Iron/TIBC/Ferritin/ %Sat    Component Value Date/Time   IRON 124 12/29/2018 0957   TIBC 319 12/29/2018 0957   FERRITIN 41 12/29/2018 0957   IRONPCTSAT 39 (H) 12/29/2018 0957   IRONPCTSAT 2 (L) 12/06/2017 0818    RADIOGRAPHIC STUDIES: I have personally reviewed the radiological images as listed and agreed with the findings in the report. No results found.   ASSESSMENT & PLAN:  1. History of iron deficiency   2. Vitamin B12 deficiency (dietary) anemia   3. Personal history of gastric bypass   4. Neuropathy   5. Vitamin D deficiency    Labs reviewed and discussed with patient. #History of iron deficiency anemia. CBC showed a stable hemoglobin. Iron panel shows stable iron store. Hold additional IV iron.    #Vitamin B12 deficiency, B12 level has improved to 226. We will proceed with vitamin B12 intramuscular 1000mcg monthly.  check MMA and homocysteine levels. Pending. - 01/01/2019 MMA is at high normal end.  Will schedule her to receive vitamin B12 injection monthly x 5.  Marland Kitchen. #Neuropathy/numbness tingling of fingertips and foot Patient has establish care with neurology.  Has had EMG and MRI done.  Continue to follow up with neurology.   #Vitamin D deficiency: Vitamin D level 34.1.  Improved.  Advised patient to continue take daily vitamin D 2000 units daily.  Orders Placed This Encounter  Procedures  . CBC with Differential/Platelet    Standing Status:   Future    Standing Expiration Date:   12/29/2019  . Vitamin B12    Standing Status:   Future    Standing Expiration Date:   12/29/2019  . Comprehensive metabolic panel    Standing Status:   Future    Standing Expiration Date:   12/29/2019  . Ferritin    Standing Status:  Future    Standing Expiration Date:   12/29/2019  . Iron and TIBC    Standing Status:   Future    Standing Expiration Date:   12/29/2019  . Vitamin D 25 hydroxy    Standing Status:   Future    Standing Expiration Date:   12/29/2019    All  questions were answered. The patient knows to call the clinic with any problems questions or concerns.  Return of visit:  6 months.    Rickard Patience, MD, PhD Hematology Oncology [facility] at [facility] Pager- [phone] 12/29/2018

## 2018-12-29 NOTE — Progress Notes (Signed)
Patient does not offer any problems today.  

## 2018-12-30 LAB — HOMOCYSTEINE: Homocysteine: 12.6 umol/L (ref 0.0–14.5)

## 2019-01-01 LAB — METHYLMALONIC ACID, SERUM: Methylmalonic Acid, Quantitative: 353 nmol/L (ref 0–378)

## 2019-01-02 ENCOUNTER — Telehealth: Payer: Self-pay

## 2019-01-02 NOTE — Telephone Encounter (Signed)
-----   Message from Earlie Server, MD sent at 01/01/2019  4:56 PM EST ----- Please arrange patient to have B12 injections every 4 weeks. Please mention the possibility that due to COVID pandemic, B12 injections maybe cancelled by Cancer center to minimize her risk. If that is the case,it is ok to skip 1-2 injections. Thank you.

## 2019-01-02 NOTE — Telephone Encounter (Signed)
Done..... B12 Inj has been scheduled as request.. Pt is aware of her upcoming appt date and times.

## 2019-01-05 ENCOUNTER — Inpatient Hospital Stay: Payer: 59 | Attending: Oncology

## 2019-01-08 ENCOUNTER — Other Ambulatory Visit: Payer: Self-pay

## 2019-01-08 DIAGNOSIS — Z20822 Contact with and (suspected) exposure to covid-19: Secondary | ICD-10-CM

## 2019-01-10 LAB — NOVEL CORONAVIRUS, NAA: SARS-CoV-2, NAA: NOT DETECTED

## 2019-01-12 ENCOUNTER — Inpatient Hospital Stay: Payer: 59

## 2019-01-19 ENCOUNTER — Inpatient Hospital Stay: Payer: 59

## 2019-01-26 ENCOUNTER — Inpatient Hospital Stay: Payer: 59

## 2019-02-12 ENCOUNTER — Other Ambulatory Visit: Payer: Self-pay | Admitting: Family Medicine

## 2019-02-12 DIAGNOSIS — Z1231 Encounter for screening mammogram for malignant neoplasm of breast: Secondary | ICD-10-CM

## 2019-03-03 ENCOUNTER — Ambulatory Visit
Admission: RE | Admit: 2019-03-03 | Discharge: 2019-03-03 | Disposition: A | Discharge status: Home / Self Care | Payer: 59 | Source: Ambulatory Visit | Attending: Family Medicine | Admitting: Family Medicine

## 2019-03-03 DIAGNOSIS — Z1231 Encounter for screening mammogram for malignant neoplasm of breast: Secondary | ICD-10-CM | POA: Diagnosis present

## 2019-03-23 ENCOUNTER — Other Ambulatory Visit: Payer: Self-pay | Admitting: Family Medicine

## 2019-03-23 DIAGNOSIS — F331 Major depressive disorder, recurrent, moderate: Secondary | ICD-10-CM

## 2019-03-23 DIAGNOSIS — F411 Generalized anxiety disorder: Secondary | ICD-10-CM

## 2019-03-23 DIAGNOSIS — F410 Panic disorder [episodic paroxysmal anxiety] without agoraphobia: Secondary | ICD-10-CM

## 2019-06-25 ENCOUNTER — Inpatient Hospital Stay: Payer: PRIVATE HEALTH INSURANCE

## 2019-06-26 ENCOUNTER — Inpatient Hospital Stay: Payer: PRIVATE HEALTH INSURANCE | Attending: Oncology

## 2019-06-26 ENCOUNTER — Other Ambulatory Visit: Payer: Self-pay

## 2019-06-26 DIAGNOSIS — E538 Deficiency of other specified B group vitamins: Secondary | ICD-10-CM | POA: Diagnosis not present

## 2019-06-26 DIAGNOSIS — D518 Other vitamin B12 deficiency anemias: Secondary | ICD-10-CM

## 2019-06-26 DIAGNOSIS — D509 Iron deficiency anemia, unspecified: Secondary | ICD-10-CM | POA: Insufficient documentation

## 2019-06-26 DIAGNOSIS — Z8639 Personal history of other endocrine, nutritional and metabolic disease: Secondary | ICD-10-CM

## 2019-06-26 DIAGNOSIS — G629 Polyneuropathy, unspecified: Secondary | ICD-10-CM

## 2019-06-26 DIAGNOSIS — Z9884 Bariatric surgery status: Secondary | ICD-10-CM

## 2019-06-26 DIAGNOSIS — E559 Vitamin D deficiency, unspecified: Secondary | ICD-10-CM

## 2019-06-26 LAB — CBC WITH DIFFERENTIAL/PLATELET
Abs Immature Granulocytes: 0.02 10*3/uL (ref 0.00–0.07)
Basophils Absolute: 0 10*3/uL (ref 0.0–0.1)
Basophils Relative: 1 %
Eosinophils Absolute: 0.2 10*3/uL (ref 0.0–0.5)
Eosinophils Relative: 2 %
HCT: 42.8 % (ref 36.0–46.0)
Hemoglobin: 14.6 g/dL (ref 12.0–15.0)
Immature Granulocytes: 0 %
Lymphocytes Relative: 32 %
Lymphs Abs: 2.3 10*3/uL (ref 0.7–4.0)
MCH: 30.5 pg (ref 26.0–34.0)
MCHC: 34.1 g/dL (ref 30.0–36.0)
MCV: 89.5 fL (ref 80.0–100.0)
Monocytes Absolute: 0.5 10*3/uL (ref 0.1–1.0)
Monocytes Relative: 7 %
Neutro Abs: 4.2 10*3/uL (ref 1.7–7.7)
Neutrophils Relative %: 58 %
Platelets: 169 10*3/uL (ref 150–400)
RBC: 4.78 MIL/uL (ref 3.87–5.11)
RDW: 12.4 % (ref 11.5–15.5)
WBC: 7.2 10*3/uL (ref 4.0–10.5)
nRBC: 0 % (ref 0.0–0.2)

## 2019-06-26 LAB — COMPREHENSIVE METABOLIC PANEL
ALT: 17 U/L (ref 0–44)
AST: 16 U/L (ref 15–41)
Albumin: 4.3 g/dL (ref 3.5–5.0)
Alkaline Phosphatase: 54 U/L (ref 38–126)
Anion gap: 7 (ref 5–15)
BUN: 18 mg/dL (ref 6–20)
CO2: 26 mmol/L (ref 22–32)
Calcium: 9.2 mg/dL (ref 8.9–10.3)
Chloride: 105 mmol/L (ref 98–111)
Creatinine, Ser: 0.86 mg/dL (ref 0.44–1.00)
GFR calc Af Amer: >60 mL/min (ref >60)
GFR calc non Af Amer: >60 mL/min (ref >60)
Glucose, Bld: 103 mg/dL — ABNORMAL HIGH (ref 70–99)
Potassium: 4.1 mmol/L (ref 3.5–5.1)
Sodium: 138 mmol/L (ref 135–145)
Total Bilirubin: 0.7 mg/dL (ref 0.3–1.2)
Total Protein: 7 g/dL (ref 6.5–8.1)

## 2019-06-26 LAB — VITAMIN B12: Vitamin B-12: 164 pg/mL — ABNORMAL LOW (ref 180–914)

## 2019-06-26 LAB — VITAMIN D 25 HYDROXY (VIT D DEFICIENCY, FRACTURES): Vit D, 25-Hydroxy: 24.93 ng/mL — ABNORMAL LOW (ref 30–100)

## 2019-06-26 LAB — IRON AND TIBC
Iron: 118 ug/dL (ref 28–170)
Saturation Ratios: 36 % — ABNORMAL HIGH (ref 10.4–31.8)
TIBC: 330 ug/dL (ref 250–450)
UIBC: 212 ug/dL

## 2019-06-26 LAB — FERRITIN: Ferritin: 36 ng/mL (ref 11–307)

## 2019-06-30 ENCOUNTER — Other Ambulatory Visit: Payer: Self-pay

## 2019-06-30 ENCOUNTER — Telehealth: Payer: Self-pay

## 2019-06-30 ENCOUNTER — Inpatient Hospital Stay: Payer: PRIVATE HEALTH INSURANCE | Attending: Oncology | Admitting: Oncology

## 2019-06-30 ENCOUNTER — Ambulatory Visit: Payer: 59

## 2019-06-30 ENCOUNTER — Inpatient Hospital Stay: Payer: PRIVATE HEALTH INSURANCE

## 2019-06-30 ENCOUNTER — Encounter: Payer: Self-pay | Admitting: Oncology

## 2019-06-30 ENCOUNTER — Ambulatory Visit: Payer: 59 | Admitting: Oncology

## 2019-06-30 VITALS — BP 123/83 | HR 71 | Temp 96.7°F | Resp 18 | Wt 196.2 lb

## 2019-06-30 DIAGNOSIS — D509 Iron deficiency anemia, unspecified: Secondary | ICD-10-CM | POA: Insufficient documentation

## 2019-06-30 DIAGNOSIS — F419 Anxiety disorder, unspecified: Secondary | ICD-10-CM | POA: Insufficient documentation

## 2019-06-30 DIAGNOSIS — Z9884 Bariatric surgery status: Secondary | ICD-10-CM | POA: Diagnosis not present

## 2019-06-30 DIAGNOSIS — E559 Vitamin D deficiency, unspecified: Secondary | ICD-10-CM | POA: Insufficient documentation

## 2019-06-30 DIAGNOSIS — E538 Deficiency of other specified B group vitamins: Secondary | ICD-10-CM | POA: Diagnosis not present

## 2019-06-30 DIAGNOSIS — G629 Polyneuropathy, unspecified: Secondary | ICD-10-CM

## 2019-06-30 DIAGNOSIS — E119 Type 2 diabetes mellitus without complications: Secondary | ICD-10-CM | POA: Insufficient documentation

## 2019-06-30 DIAGNOSIS — Z87891 Personal history of nicotine dependence: Secondary | ICD-10-CM | POA: Diagnosis not present

## 2019-06-30 MED ORDER — CYANOCOBALAMIN 1000 MCG/ML IJ SOLN: 1000.0000 ug | Freq: Once | INTRAMUSCULAR | Status: DC

## 2019-06-30 NOTE — Telephone Encounter (Signed)
Patient is not in the exam room or in lab waiting area for her B12 injection, left before receiving B12 injection.  Called patient and left a message informing her that Dr. Cathie Hoops did want her to receive her B12 inj today and to call the office to get this r/s.

## 2019-06-30 NOTE — Telephone Encounter (Signed)
She called back just before I got your message and wants to schedule this, Please have Alvy Beal call her for appointment date and time

## 2019-06-30 NOTE — Progress Notes (Signed)
Patient here for follow up. No new concerns voiced.  °

## 2019-06-30 NOTE — Telephone Encounter (Signed)
Done... Pt requested to come back on 07/01/19 for her b12 Inj. Appt is scheduled

## 2019-06-30 NOTE — Progress Notes (Signed)
Hematology/Oncology follow up  note [facility] Telephone:([phone] Fax:(336) [phone]   Patient Care Team: Galen Manila, NP (Inactive) as PCP - General (Nurse Practitioner)  REFERRING PROVIDER: Galen Manila, NP (Inactive)  CHIEF COMPLAINTS/REASON FOR VISIT:  Evaluation of iron deficiency anemia  HISTORY OF PRESENTING ILLNESS:  [name] is a  44 y.o.  female with PMH listed below who was referred to me for evaluation of iron deficiency anemia Reviewed patient's recent labs that was done at [facility] office. 12/06/2017 Labs revealed anemia with hemoglobin of 9.6, mcv 69.9.  Iron panel showed ferritin of 2, iron saturation 2, TIBC 446.  Folate 7.2, vitamin b12 112 Associated signs and symptoms: Patient reports fatigue.  Mild SOB with exertion.  Denies weight loss, easy bruising, hematochezia, hemoptysis, hematuria. 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  On lexapro and  was referred to psych for evaluation.  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  A1c was checked recently and was 5.2  INTERVAL HISTORY [name] is a 44 y.o. 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 Patient continues to feel fatigued.   Review of Systems  Constitutional: Positive for malaise/fatigue. Negative for chills, fever and weight loss.  HENT: Negative for sore throat.   Eyes: Negative for redness.  Respiratory: Negative for cough, shortness of breath and wheezing.   Cardiovascular: Negative  for chest pain, palpitations and leg swelling.  Gastrointestinal: Negative for abdominal pain, blood in stool, nausea and vomiting.  Genitourinary: Negative for dysuria.  Musculoskeletal: Negative for myalgias.  Skin: Negative for rash.  Neurological: Positive for sensory change. Negative for dizziness, tingling and tremors.  Endo/Heme/Allergies: Does not bruise/bleed easily.  Psychiatric/Behavioral: Positive for depression. Negative for hallucinations and suicidal ideas.    MEDICAL HISTORY:  Past Medical History:  Diagnosis Date  . Anemia   . Anxiety   . Chronic headaches   . Depression   . Quit smoking 06/29/2016    SURGICAL HISTORY: Past Surgical History:  Procedure Laterality Date  . ABDOMINAL HYSTERECTOMY     partial  . bartholins cyst removal  2017  . BREAST BIOPSY Right 2008   CORE - NEG  . BREAST EXCISIONAL BIOPSY Right 2010   NEG  . CESAREAN SECTION    . GALLBLADDER SURGERY  2012  . GASTRIC BYPASS  2012  . TUBAL LIGATION    . VAGINAL HYSTERECTOMY  2006   ovaries remain    SOCIAL HISTORY: Social History   Socioeconomic History  . Marital status: Widowed    Spouse name: Not on file  . Number of children: Not on file  . Years of education: Not on file  . Highest education level: Not on file  Occupational History  . Not on file  Tobacco Use  . Smoking status: Former Smoker    Packs/day: 1.00    Years: 15.00    Pack years: 15.00    Quit date: 2018    Years since quitting: 3.4  . Smokeless tobacco: Former Engineer, water and Sexual Activity  . Alcohol use: Yes    Alcohol/week:  0.0 standard drinks  . Drug use: No  . Sexual activity: Yes    Birth control/protection: None  Other Topics Concern  . Not on file  Social History Narrative  . Not on file   Social Determinants of Health   Financial Resource Strain:   . Difficulty of Paying Living Expenses:   Food Insecurity:   . Worried About Programme researcher, broadcasting/film/video in the Last Year:   . Barista  in the Last Year:   Transportation Needs:   . Freight forwarder (Medical):   Marland Kitchen Lack of Transportation (Non-Medical):   Physical Activity:   . Days of Exercise per Week:   . Minutes of Exercise per Session:   Stress:   . Feeling of Stress :   Social Connections:   . Frequency of Communication with Friends and Family:   . Frequency of Social Gatherings with Friends and Family:   . Attends Religious Services:   . Active Member of Clubs or Organizations:   . Attends Banker Meetings:   Marland Kitchen Marital Status:   Intimate Partner Violence:   . Fear of Current or Ex-Partner:   . Emotionally Abused:   Marland Kitchen Physically Abused:   . Sexually Abused:     FAMILY HISTORY: Family History  Problem Relation Age of Onset  . Breast cancer Mother        58'S  . Breast cancer Maternal Grandmother        60'S  . Hypothyroidism Father   . Multiple sclerosis Maternal Aunt   . Multiple sclerosis Maternal Aunt   . Multiple sclerosis Cousin     ALLERGIES:  is allergic to morphine.  MEDICATIONS:  Current Outpatient Medications  Medication Sig Dispense Refill  . cholecalciferol (VITAMIN D3) 25 MCG (1000 UT) tablet Take 2 tablets (2,000 Units total) by mouth daily. 60 tablet 3  . escitalopram (LEXAPRO) 20 MG tablet TAKE 1 TABLET (20 MG TOTAL) BY MOUTH DAILY. MAY CUT IN HALF FOR DOSE 10MG  FOR FIRST 2-4 WEEKS, INCREASE TO 1 WHOLE TABLET 20MG  DAILY IF NEEDED 30 tablet 2  . hydrOXYzine (ATARAX/VISTARIL) 10 MG tablet Take 1 tablet (10 mg total) by mouth every 8 (eight) hours as needed for anxiety. 30 tablet 1  . ibuprofen (ADVIL) 600 MG tablet Take 600 mg by mouth every 6 (six) hours as needed.      No current facility-administered medications for this visit.   Facility-Administered Medications Ordered in Other Visits  Medication Dose Route Frequency Provider Last Rate Last Admin  . cyanocobalamin ((VITAMIN B-12)) injection 1,000 mcg  1,000 mcg Intramuscular Once , MD          PHYSICAL EXAMINATION: ECOG PERFORMANCE STATUS: 1 - Symptomatic but completely ambulatory Vitals:   06/30/19 1313  BP: 123/83  Pulse: 71  Resp: 18  Temp: (!) 96.7 F (35.9 C)   Filed Weights   06/30/19 1313  Weight: 196 lb 3.2 oz (89 kg)    Physical Exam Constitutional:      General: She is not in acute distress. HENT:     Head: Normocephalic and atraumatic.  Eyes:     General: No scleral icterus.    Pupils: Pupils are equal, round, and reactive to light.  Cardiovascular:     Rate and Rhythm: Normal rate and regular rhythm.     Heart sounds: Normal heart sounds.  Pulmonary:     Effort: Pulmonary effort is normal. No respiratory distress.     Breath sounds:  No wheezing.  Abdominal:     General: Bowel sounds are normal. There is no distension.     Palpations: Abdomen is soft. There is no mass.     Tenderness: There is no abdominal tenderness.  Musculoskeletal:        General: No deformity. Normal range of motion.     Cervical back: Normal range of motion and neck supple.  Skin:    General: Skin is warm and dry.     Findings: No erythema or rash.  Neurological:     Mental Status: She is alert and oriented to person, place, and time.     Cranial Nerves: No cranial nerve deficit.     Coordination: Coordination normal.  Psychiatric:        Mood and Affect: Mood normal.        Behavior: Behavior normal.        Thought Content: Thought content normal.      LABORATORY DATA:  I have reviewed the data as listed Lab Results  Component Value Date   WBC 7.2 06/26/2019   HGB 14.6 06/26/2019   HCT 42.8 06/26/2019   MCV 89.5 06/26/2019   PLT 169 06/26/2019   Recent Labs    09/18/18 1101 12/29/18 0957 06/26/19 1340  NA 137 135 138  K 4.2 4.1 4.1  CL 105 101 105  CO2 25 27 26   GLUCOSE 104* 87 103*  BUN 10 9 18   CREATININE 0.64 0.77 0.86  CALCIUM 8.8* 8.8* 9.2  GFRNONAA >60 >60 >60  GFRAA >60 >60 >60  PROT 6.7 6.6 7.0  ALBUMIN 4.0 4.1 4.3  AST 16 16 16    ALT 13 12 17   ALKPHOS 60 55 54  BILITOT 0.8 0.7 0.7   Iron/TIBC/Ferritin/ %Sat    Component Value Date/Time   IRON 118 06/26/2019 1340   TIBC 330 06/26/2019 1340   FERRITIN 36 06/26/2019 1340   IRONPCTSAT 36 (H) 06/26/2019 1340   IRONPCTSAT 2 (L) 12/06/2017 0818    RADIOGRAPHIC STUDIES: I have personally reviewed the radiological images as listed and agreed with the findings in the report. No results found.   ASSESSMENT & PLAN:  1. Vitamin B12 deficiency   2. Personal history of gastric bypass   3. Neuropathy   4. Vitamin D deficiency    History of iron deficiency anemia, iron labs are reviewed and discussed with patient No need for IV iron at this point.  Iron levels are stable.  Vitamin B12 deficiency, patient was scheduled to receive vitamin B12 injections monthly x 5 at the last visit. I will think she got vitamin B12 injection in the past injection she had was in November 2020. I discussed with patient that vitamin B12 level is low today again .  I recommend patient to proceed with weekly vitamin B12 intramuscularly followed by vitamin B12 monthly. Follow-up in 6 months I discussed with her that her fatigue may or may not be related to B12 deficiency.  She denies feeling any difference after previous vitamin B12 injections.  May be related to her other comorbidities.  Neuropathy, continue follow-up with neurology.  May be partially secondary to vitamin B12 deficiency. #Vitamin D deficiency: Vitamin D level is at 24.  Slightly decreased.  Recommend patient to take vitamin D 3000 units daily.  Patient prefers to buy over-the-counter vitamin D supplementation.  Orders Placed This Encounter  Procedures  . Iron and TIBC    Standing Status:   Future    Standing Expiration Date:  06/29/2020  . Ferritin    Standing Status:   Future    Standing Expiration Date:   12/30/2019  . CBC with Differential/Platelet    Standing Status:   Future    Standing Expiration Date:   06/29/2020   . Comprehensive metabolic panel    Standing Status:   Future    Standing Expiration Date:   06/29/2020  . Vitamin B12    Standing Status:   Future    Standing Expiration Date:   06/29/2020  . Folate    Standing Status:   Future    Standing Expiration Date:   06/29/2020  . Vitamin D 25 hydroxy    Standing Status:   Future    Standing Expiration Date:   06/29/2020    All questions were answered. The patient knows to call the clinic with any problems questions or concerns.  Return of visit:  6 months.    Earlie Server, MD, PhD Hematology Oncology [facility] at [facility] Pager- [phone] 06/30/2019

## 2019-07-01 ENCOUNTER — Inpatient Hospital Stay: Payer: PRIVATE HEALTH INSURANCE

## 2019-07-01 DIAGNOSIS — D509 Iron deficiency anemia, unspecified: Secondary | ICD-10-CM | POA: Diagnosis not present

## 2019-07-01 MED ORDER — CYANOCOBALAMIN 1000 MCG/ML IJ SOLN
1000.0000 ug | Freq: Once | INTRAMUSCULAR | Status: AC
  Administered 2019-07-01: 1000 ug via INTRAMUSCULAR
  Filled 2019-07-01: qty 1

## 2019-07-07 ENCOUNTER — Other Ambulatory Visit: Payer: Self-pay

## 2019-07-07 ENCOUNTER — Inpatient Hospital Stay: Payer: PRIVATE HEALTH INSURANCE

## 2019-07-07 DIAGNOSIS — D509 Iron deficiency anemia, unspecified: Secondary | ICD-10-CM | POA: Diagnosis not present

## 2019-07-07 MED ORDER — CYANOCOBALAMIN 1000 MCG/ML IJ SOLN
1000.0000 ug | Freq: Once | INTRAMUSCULAR | Status: AC
  Administered 2019-07-07: 1000 ug via INTRAMUSCULAR
  Filled 2019-07-07: qty 1

## 2019-07-14 ENCOUNTER — Inpatient Hospital Stay: Payer: PRIVATE HEALTH INSURANCE

## 2019-07-21 ENCOUNTER — Ambulatory Visit: Payer: PRIVATE HEALTH INSURANCE

## 2019-07-21 ENCOUNTER — Inpatient Hospital Stay: Payer: PRIVATE HEALTH INSURANCE

## 2019-07-22 ENCOUNTER — Other Ambulatory Visit: Payer: Self-pay | Admitting: Family Medicine

## 2019-07-22 DIAGNOSIS — F331 Major depressive disorder, recurrent, moderate: Secondary | ICD-10-CM

## 2019-07-22 DIAGNOSIS — F410 Panic disorder [episodic paroxysmal anxiety] without agoraphobia: Secondary | ICD-10-CM

## 2019-07-22 NOTE — Telephone Encounter (Signed)
Requested Prescriptions  Pending Prescriptions Disp Refills   escitalopram (LEXAPRO) 20 MG tablet [Pharmacy Med Name: ESCITALOPRAM 20 MG TABLET] 30 tablet 0    Sig: TAKE 1 TABLET (20 MG TOTAL) BY MOUTH DAILY. MAY CUT IN HALF FOR DOSE 10MG  FOR FIRST 2-4 WEEKS, INCREASE TO 1 WHOLE TABLET 20MG  DAILY IF NEEDED     Psychiatry:  Antidepressants - SSRI Failed - 07/22/2019  1:18 AM      Failed - Valid encounter within last 6 months    Recent Outpatient Visits          7 months ago Annual physical exam   [facility] 07/24/2019, DO   1 year ago Numbness and tingling in both hands   [facility] [place], [facility], DO   1 year ago Encounter to establish care   [facility] Netta Neat, [facility], NP   2 years ago Annual physical exam   [facility] Alison Stalling, MD   4 years ago Breast mass, right   [facility] Duanne Limerick, MD             Passed - Completed PHQ-2 or PHQ-9 in the last 360 days.      One month courtesy refill with reminder for patient to schedule 6 month follow up per protocol.

## 2019-08-20 ENCOUNTER — Ambulatory Visit: Payer: PRIVATE HEALTH INSURANCE

## 2019-08-25 ENCOUNTER — Inpatient Hospital Stay: Payer: PRIVATE HEALTH INSURANCE | Attending: Oncology

## 2019-08-26 ENCOUNTER — Other Ambulatory Visit: Payer: PRIVATE HEALTH INSURANCE

## 2019-08-28 ENCOUNTER — Ambulatory Visit: Payer: PRIVATE HEALTH INSURANCE

## 2019-08-28 ENCOUNTER — Ambulatory Visit: Payer: PRIVATE HEALTH INSURANCE | Admitting: Oncology

## 2019-09-11 ENCOUNTER — Other Ambulatory Visit: Payer: Self-pay | Admitting: Nurse Practitioner

## 2019-09-11 DIAGNOSIS — F331 Major depressive disorder, recurrent, moderate: Secondary | ICD-10-CM

## 2019-09-11 DIAGNOSIS — F410 Panic disorder [episodic paroxysmal anxiety] without agoraphobia: Secondary | ICD-10-CM

## 2019-09-11 DIAGNOSIS — F411 Generalized anxiety disorder: Secondary | ICD-10-CM

## 2019-09-11 NOTE — Telephone Encounter (Signed)
Medication: escitalopram (LEXAPRO) 20 MG tablet [[id number]]  Has the patient contacted their pharmacy? YES (Agent: If no, request that the patient contact the pharmacy for the refill.) (Agent: If yes, when and what did the pharmacy advise?)  Preferred Pharmacy (with phone number or street name): [id number] - [place], [place] - [address] [place] [address] Phone: [phone] Fax: [phone] Hours: Not open 24 hours    Agent: Please be advised that RX refills may take up to 3 business days. We ask that you follow-up with your pharmacy.

## 2019-09-11 NOTE — Telephone Encounter (Signed)
Requested medication (s) are due for refill today: yes  Requested medication (s) are on the active medication list: yes  Last refill:  07/22/2019  Future visit scheduled: no  Notes to clinic:  Patient still has not schedule a appointment    Requested Prescriptions  Pending Prescriptions Disp Refills   escitalopram (LEXAPRO) 20 MG tablet 30 tablet 0    Sig: Take 1 tablet (20 mg total) by mouth daily. May cut in half for dose 10mg  for first 2-4 weeks, increase to 1 whole tablet 20mg  daily if needed      Psychiatry:  Antidepressants - SSRI Failed - 09/11/2019 10:14 AM      Failed - Valid encounter within last 6 months    Recent Outpatient Visits           9 months ago Annual physical exam   [facility] 09/13/2019, DO   1 year ago Numbness and tingling in both hands   [facility] [place], [facility], DO   1 year ago Encounter to establish care   [facility] Netta Neat, [facility], NP   3 years ago Annual physical exam   [facility] Alison Stalling, MD   4 years ago Breast mass, right   [facility] Duanne Limerick, MD              Passed - Completed PHQ-2 or PHQ-9 in the last 360 days.

## 2019-09-18 ENCOUNTER — Ambulatory Visit: Payer: PRIVATE HEALTH INSURANCE

## 2019-09-22 ENCOUNTER — Inpatient Hospital Stay: Payer: PRIVATE HEALTH INSURANCE | Attending: Oncology

## 2019-10-06 ENCOUNTER — Other Ambulatory Visit: Payer: Self-pay | Admitting: Family Medicine

## 2019-10-06 DIAGNOSIS — F331 Major depressive disorder, recurrent, moderate: Secondary | ICD-10-CM

## 2019-10-06 DIAGNOSIS — F410 Panic disorder [episodic paroxysmal anxiety] without agoraphobia: Secondary | ICD-10-CM

## 2019-10-06 NOTE — Telephone Encounter (Signed)
Requested medication (s) are due for refill today -yes  Requested medication (s) are on the active medication list -yes  Future visit scheduled -no  Last refill: 09/13/19  Notes to clinic: Attempted to call patient to schedule appointment- left message to call back for appointment. Courtesy RF given with appt note last fill.  Requested Prescriptions  Pending Prescriptions Disp Refills   escitalopram (LEXAPRO) 20 MG tablet [Pharmacy Med Name: ESCITALOPRAM 20 MG TABLET] 30 tablet 0    Sig: TAKE 1 TABLET BY MOUTH EVERY DAY (MAY TAKE 1/2 TAB FOR 2-4 WEEKS AND THEN INCREASE IF NEEDED)      Psychiatry:  Antidepressants - SSRI Failed - 10/06/2019  1:30 AM      Failed - Valid encounter within last 6 months    Recent Outpatient Visits           10 months ago Annual physical exam   [facility] Smitty Cords, DO   1 year ago Numbness and tingling in both hands   [facility] [place], Netta Neat, DO   1 year ago Encounter to establish care   [facility] Kyung Rudd, Alison Stalling, NP   3 years ago Annual physical exam   [facility] Duanne Limerick, MD   4 years ago Breast mass, right   [facility] Duanne Limerick, MD              Passed - Completed PHQ-2 or PHQ-9 in the last 360 days.          Requested Prescriptions  Pending Prescriptions Disp Refills   escitalopram (LEXAPRO) 20 MG tablet [Pharmacy Med Name: ESCITALOPRAM 20 MG TABLET] 30 tablet 0    Sig: TAKE 1 TABLET BY MOUTH EVERY DAY (MAY TAKE 1/2 TAB FOR 2-4 WEEKS AND THEN INCREASE IF NEEDED)      Psychiatry:  Antidepressants - SSRI Failed - 10/06/2019  1:30 AM      Failed - Valid encounter within last 6 months    Recent Outpatient Visits           10 months ago Annual physical exam   [facility] Smitty Cords, DO   1 year ago Numbness and tingling in both hands   [facility] [place], Netta Neat,  DO   1 year ago Encounter to establish care   [facility] Kyung Rudd, Alison Stalling, NP   3 years ago Annual physical exam   [facility] Duanne Limerick, MD   4 years ago Breast mass, right   [facility] Duanne Limerick, MD              Passed - Completed PHQ-2 or PHQ-9 in the last 360 days.

## 2019-10-19 ENCOUNTER — Ambulatory Visit: Payer: PRIVATE HEALTH INSURANCE

## 2019-10-22 ENCOUNTER — Inpatient Hospital Stay: Payer: Self-pay | Attending: Oncology

## 2019-11-19 ENCOUNTER — Ambulatory Visit: Payer: PRIVATE HEALTH INSURANCE

## 2019-11-23 ENCOUNTER — Inpatient Hospital Stay: Payer: Self-pay | Attending: Oncology

## 2019-11-25 IMAGING — MR MR HEAD WO/W CM
13 series · 48 of 48 positions shown · IV contrast (gadavist)
Comparison: Head CT 03/14/2007

CLINICAL DATA: Vision changes, slurred speech, urinary
incontinence, numbness in the hands and feet, and right lower
extremity weakness for 6 months. Evaluation for multiple sclerosis.
Family history of multiple sclerosis.

EXAM:
MRI HEAD WITHOUT AND WITH CONTRAST
TECHNIQUE: Multiplanar, multiecho pulse sequences of the brain and surrounding
structures were obtained without and with intravenous contrast.
CONTRAST:  8 mL Gadavist

[Series 2: T1 · sagittal · 5.0mm · 0.45mm/px · 1 of 23 slices shown (1 of 2)]
[im 1/23]
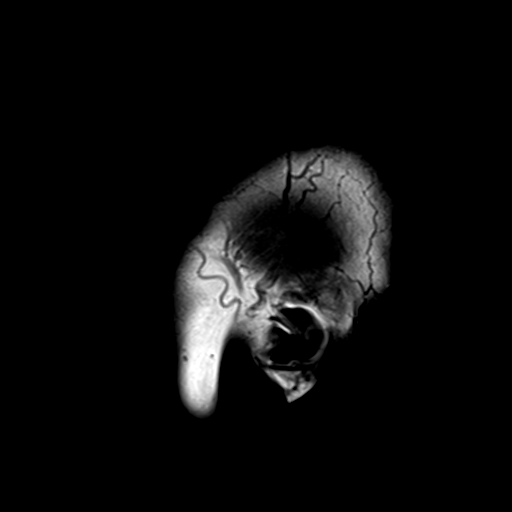

[Series 4: DWI · axial · 3.0mm · 1.20mm/px · z∈[-64,+85]mm · 3 of 51 slices shown (1 of 4)]
[im 1/51]
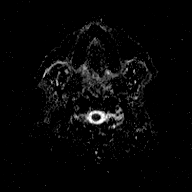
[im 26/51]
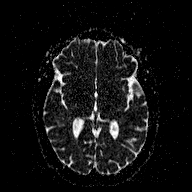
[im 51/51]
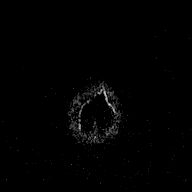

[Series 6: DWI · coronal · 3.0mm · 1.15mm/px · 3 of 45 slices shown (2 of 4)]
[im 1/45]
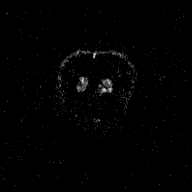
[im 23/45]
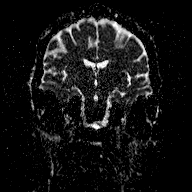
[im 45/45]
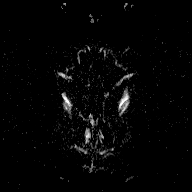

[Series 7: FLAIR · sagittal · 4.0mm · 0.45mm/px · 2 of 30 slices shown (1 of 2)]
[im 1/30]
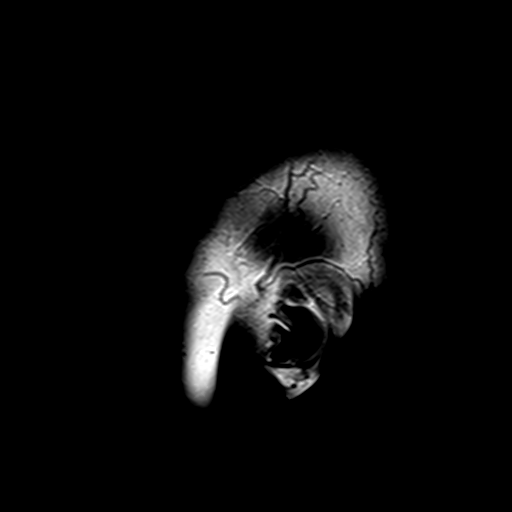
[im 30/30]
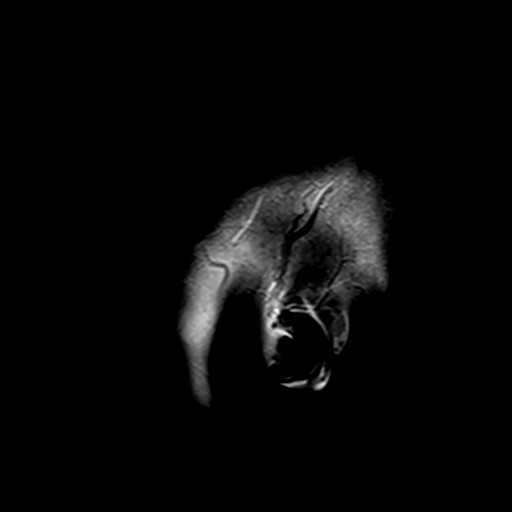

[Series 8: T2 · axial · 5.0mm · 0.72mm/px · z∈[-66,+87]mm · 2 of 23 slices shown (1 of 2)]
[im 1/23]
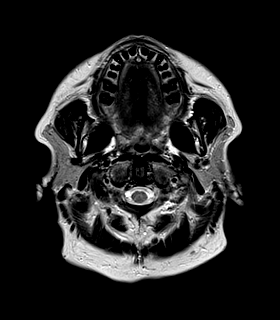
[im 23/23]
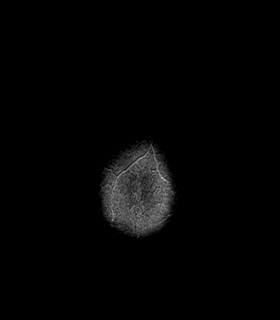

[Series 9: FLAIR · axial · 3.0mm · 0.45mm/px · z∈[-70,+91]mm · 4 of 55 slices shown (2 of 2)]
[im 1/55]
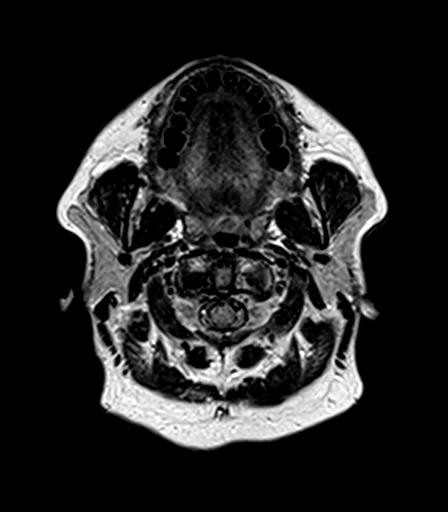
[im 19/55]
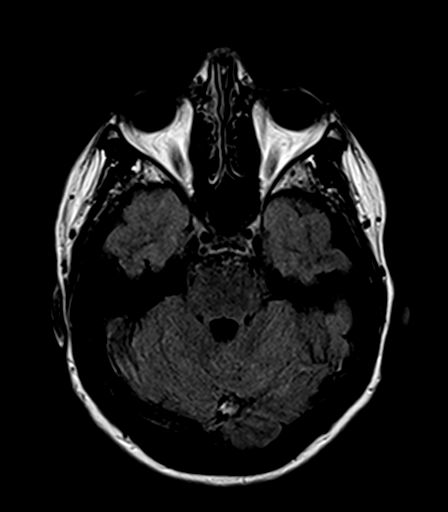
[im 37/55]
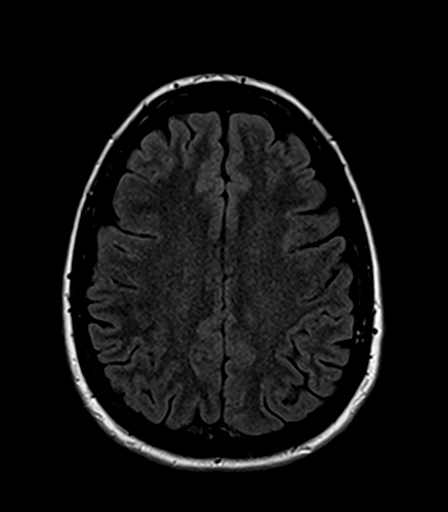
[im 55/55]
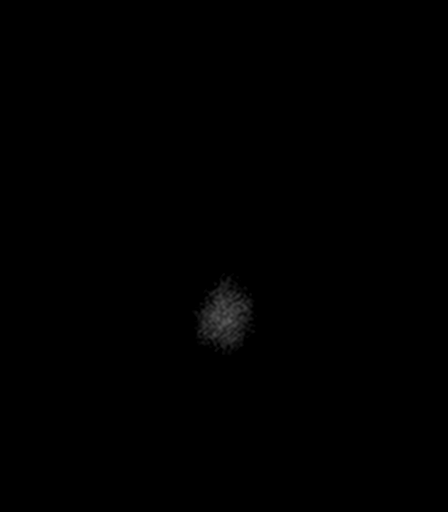

[Series 10: T2 · axial · 5.0mm · 0.72mm/px · z∈[-66,+87]mm · 2 of 23 slices shown (2 of 2)]
[im 1/23]
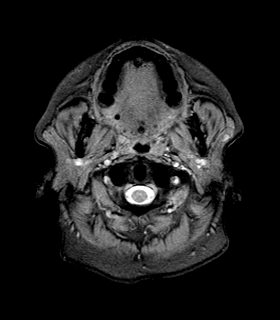
[im 23/23]
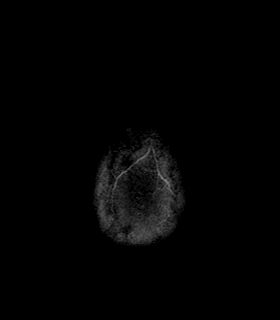

[Series 11: T1 · axial · 1.0mm · 1.00mm/px · z∈[-67,+91]mm · 11 of 160 slices shown (2 of 2)]
[im 1/160]
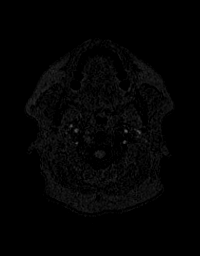
[im 16/160]
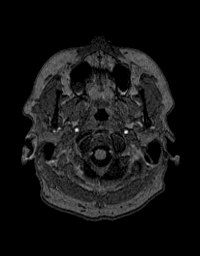
[im 32/160]
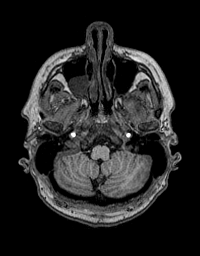
[im 48/160]
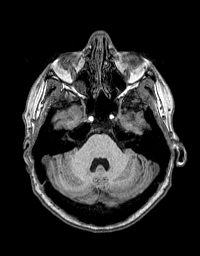
[im 64/160]
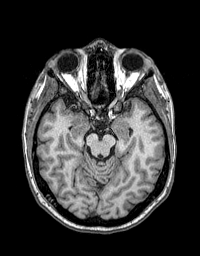
[im 80/160]
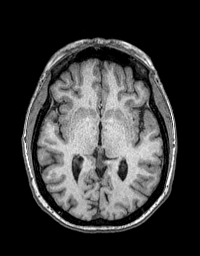
[im 96/160]
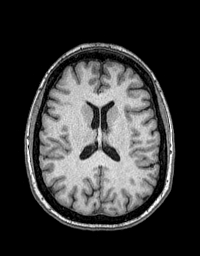
[im 112/160]
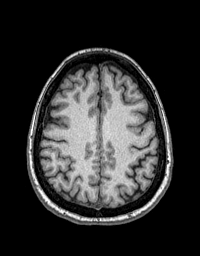
[im 128/160]
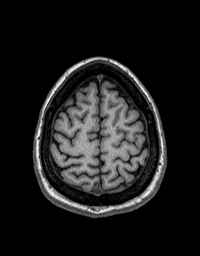
[im 144/160]
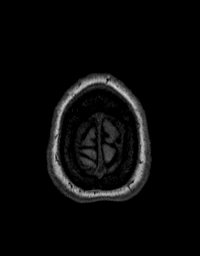
[im 160/160]
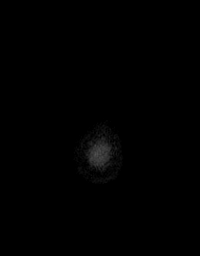

[Series 12: T2 post-contrast · coronal · 5.0mm · 0.43mm/px · 2 of 28 slices shown]
[im 1/28]
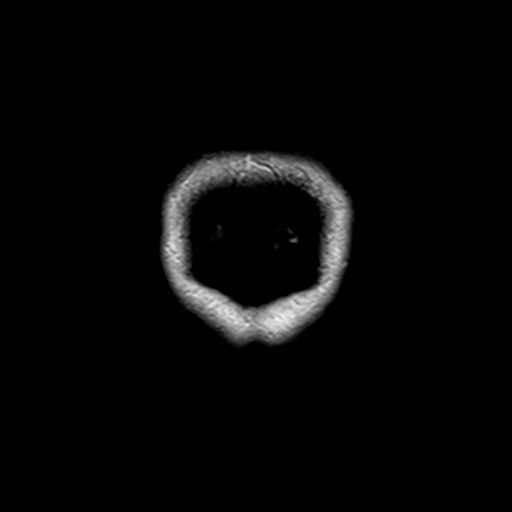
[im 28/28]
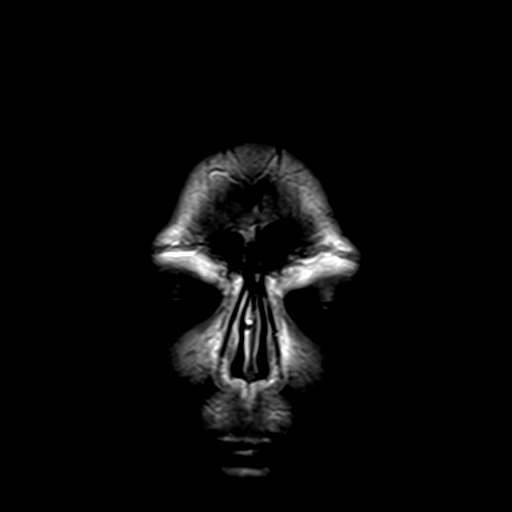

[Series 13: T1 post-contrast · axial · 1.0mm · 1.00mm/px · z∈[-59,+83]mm · 10 of 144 slices shown (1 of 2)]
[im 1/144]
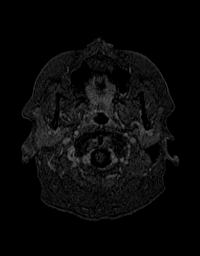
[im 16/144]
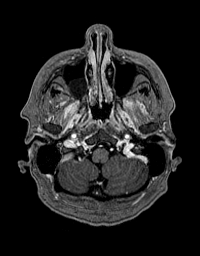
[im 32/144]
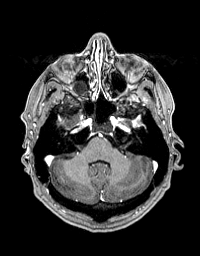
[im 48/144]
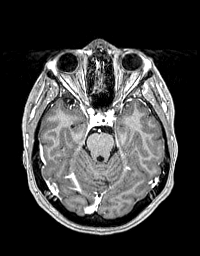
[im 64/144]
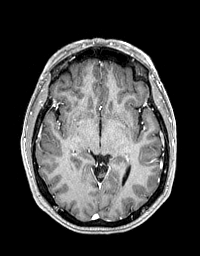
[im 80/144]
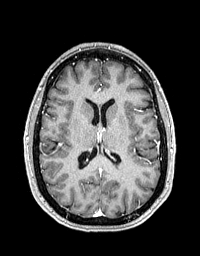
[im 96/144]
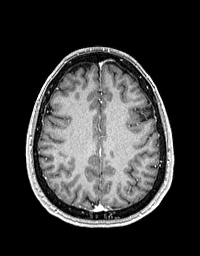
[im 112/144]
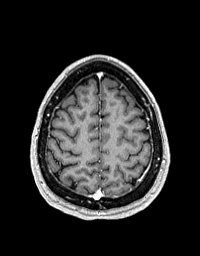
[im 128/144]
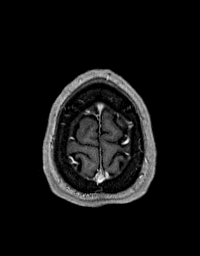
[im 144/144]
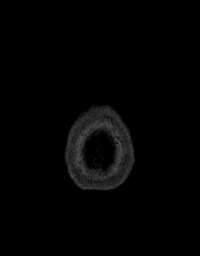

[Series 14: T1 post-contrast · coronal · 5.0mm · 0.43mm/px · 2 of 28 slices shown (2 of 2)]
[im 1/28]
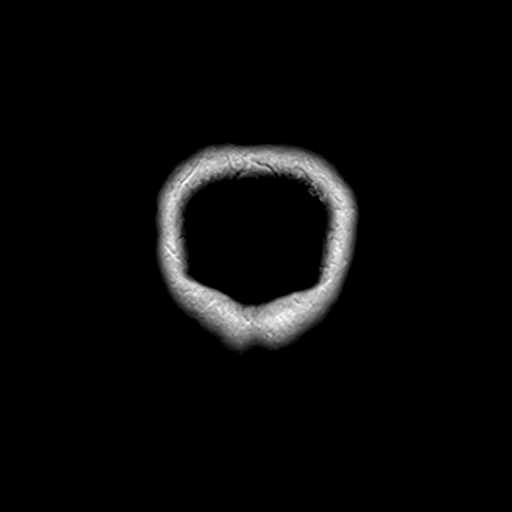
[im 28/28]
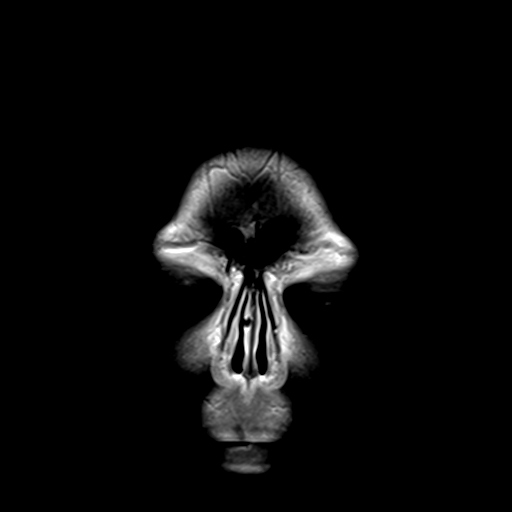

[Series 100: DWI · axial · 3.0mm · 1.20mm/px · z∈[-64,+85]mm · 3 of 51 slices shown (3 of 4)]
[im 1/51]
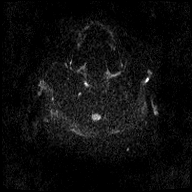
[im 26/51]
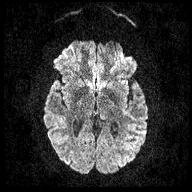
[im 51/51]
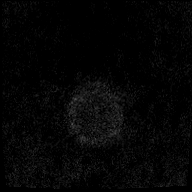

[Series 101: DWI · coronal · 3.0mm · 1.15mm/px · 3 of 45 slices shown (4 of 4)]
[im 1/45]
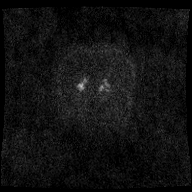
[im 23/45]
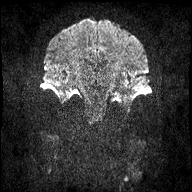
[im 45/45]
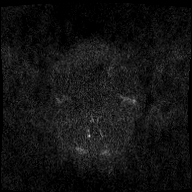

[48 of 48 positions shown; findings below may reference images not displayed]

FINDINGS: Brain: There is no evidence of acute infarct, intracranial
hemorrhage, mass, midline shift, or extra-axial fluid collection.
The ventricles and sulci are normal. The brain is normal in signal
without evidence of demyelinating disease. A small developmental
venous anomaly is incidentally noted in the right cerebellum, a
normal variant. No abnormal brain parenchymal or meningeal
enhancement is identified.

Vascular: Major intracranial vascular flow voids are preserved.

Skull and upper cervical spine: Unremarkable bone marrow signal.

Sinuses/Orbits: Unremarkable orbits. Right maxillary sinus mucous
retention cyst, similar to the prior CT. Resolution of left
maxillary sinus mucous retention cyst. Clear mastoid air cells.

Other: None.
IMPRESSION: Unremarkable appearance of the brain.

## 2019-12-11 ENCOUNTER — Other Ambulatory Visit: Payer: Self-pay

## 2019-12-11 ENCOUNTER — Ambulatory Visit (INDEPENDENT_AMBULATORY_CARE_PROVIDER_SITE_OTHER): Payer: Self-pay | Admitting: Family Medicine

## 2019-12-11 ENCOUNTER — Encounter: Payer: Self-pay | Admitting: Family Medicine

## 2019-12-11 VITALS — BP 116/69 | HR 59 | Ht 66.0 in | Wt 191.0 lb

## 2019-12-11 DIAGNOSIS — N6310 Unspecified lump in the right breast, unspecified quadrant: Secondary | ICD-10-CM | POA: Insufficient documentation

## 2019-12-11 DIAGNOSIS — Z803 Family history of malignant neoplasm of breast: Secondary | ICD-10-CM | POA: Insufficient documentation

## 2019-12-11 NOTE — Assessment & Plan Note (Signed)
Interested in genetic testing, discussed referral to OB/GYN, patient interested, referral placed.

## 2019-12-11 NOTE — Progress Notes (Signed)
Subjective:    Patient ID: [name], female    DOB: May 18, 1975, 44 y.o.   MRN: [id number]  [name] is a 44 y.o. female presenting on 12/11/2019 for Breast Pain (Right breast pain and small lump. X 1.5 week. )   HPI  [name] presents to clinic for concerns of right breast pain with small lump x 1.5 weeks.  Has a family history of breast cancer in her mother (dx in her 34's), her maternal grandfather's family as well.  Has not had genetic testing but is interested in meeting with an OB/GYN to discuss.  Denies any skin dimpling, puckering, discharge from nipple, skin color changes, fevers, lymphadenopathy, or unintentional weight loss.  Depression screen [facility] 2/9 12/11/2019 12/09/2018 07/03/2018  Decreased Interest 1 3 1   Down, Depressed, Hopeless 1 3 1   PHQ - 2 Score 2 6 2   Altered sleeping 3 2 3   Tired, decreased energy 3 3 3   Change in appetite 3 3 3   Feeling bad or failure about yourself  1 2 1   Trouble concentrating 3 3 3   Moving slowly or fidgety/restless 0 0 3  Suicidal thoughts 0 1 0  PHQ-9 Score 15 20 18   Difficult doing work/chores Somewhat difficult Extremely dIfficult Somewhat difficult    Social History   Tobacco Use  . Smoking status: Former Smoker    Packs/day: 1.00    Years: 15.00    Pack years: 15.00    Quit date: 2018    Years since quitting: 3.8  . Smokeless tobacco: Former  . Vaping Use: Every day  Substance Use Topics  . Alcohol use: Yes    Alcohol/week: 0.0 standard drinks  . Drug use: No    Review of Systems  Constitutional: Negative.   HENT: Negative.   Eyes: Negative.   Respiratory: Negative.   Cardiovascular: Negative.   Gastrointestinal: Negative.   Endocrine: Negative.   Genitourinary: Negative.   Musculoskeletal: Negative.   Skin: Negative.        Right breast lump  Allergic/Immunologic: Negative.   Neurological: Negative.   Hematological: Negative.   Psychiatric/Behavioral: Negative.     Per HPI unless specifically indicated above     Objective:    BP 116/69   Pulse (!) 59   Ht 5\' 6"  (1.676 m)   Wt 191 lb (86.6 kg)   LMP  (LMP Unknown)   SpO2 98%   BMI 30.83 kg/m   Wt Readings from Last 3 Encounters:  12/11/19 191 lb (86.6 kg)  06/30/19 196 lb 3.2 oz (89 kg)  12/29/18 194 lb 12.8 oz (88.4 kg)    Physical Exam Vitals and nursing note reviewed.  Constitutional:      General: She is not in acute distress.    Appearance: Normal appearance. She is well-developed and well-groomed. She is obese. She is not ill-appearing or toxic-appearing.  HENT:     Head: Normocephalic and atraumatic.     Nose:     Comments: is in place, covering mouth and nose. Eyes:     General: Lids are normal. Vision grossly intact.        Right eye: No discharge.        Left eye: No discharge.     Extraocular Movements: Extraocular movements intact.     Conjunctiva/sclera: Conjunctivae normal.     Pupils: Pupils are equal, round, and reactive to light.  Cardiovascular:     Pulses: Normal pulses.  Pulmonary:  Effort: Pulmonary effort is normal. No respiratory distress.  Chest:     Breasts:        Right: Mass and tenderness present. No bleeding, inverted nipple, nipple discharge or skin change.        Left: No swelling, bleeding, inverted nipple, mass, nipple discharge, skin change or tenderness.       Comments: Right breast lump, approx 5cm Lymphadenopathy:     Upper Body:     Right upper body: No supraclavicular or axillary adenopathy.     Left upper body: No supraclavicular or axillary adenopathy.  Skin:    General: Skin is warm and dry.     Capillary Refill: Capillary refill takes less than 2 seconds.  Neurological:     General: No focal deficit present.     Mental Status: She is alert and oriented to person, place, and time.  Psychiatric:        Attention and Perception: Attention and perception normal.        Mood and Affect: Mood and affect normal.         Speech: Speech normal.        Behavior: Behavior normal. Behavior is cooperative.        Thought Content: Thought content normal.        Cognition and Memory: Cognition and memory normal.        Judgment: Judgment normal.    Results for orders placed or performed in visit on 06/26/19  Vitamin D 25 hydroxy  Result Value Ref Range   Vit D, 25-Hydroxy 24.93 (L) 30 - 100 ng/mL  Iron and TIBC  Result Value Ref Range   Iron 118 28 - 170 ug/dL   TIBC 761 607 - 371 ug/dL   Saturation Ratios 36 (H) 10.4 - 31.8 %   UIBC 212 ug/dL  Ferritin  Result Value Ref Range   Ferritin 36 11 - 307 ng/mL  Comprehensive metabolic panel  Result Value Ref Range   Sodium 138 135 - 145 mmol/L   Potassium 4.1 3.5 - 5.1 mmol/L   Chloride 105 98 - 111 mmol/L   CO2 26 22 - 32 mmol/L   Glucose, Bld 103 (H) 70 - 99 mg/dL   BUN 18 6 - 20 mg/dL   Creatinine, Ser 0.62 0.44 - 1.00 mg/dL   Calcium 9.2 8.9 - 69.4 mg/dL   Total Protein 7.0 6.5 - 8.1 g/dL   Albumin 4.3 3.5 - 5.0 g/dL   AST 16 15 - 41 U/L   ALT 17 0 - 44 U/L   Alkaline Phosphatase 54 38 - 126 U/L   Total Bilirubin 0.7 0.3 - 1.2 mg/dL   GFR calc non Af Amer >60 >60 mL/min   GFR calc Af Amer >60 >60 mL/min   Anion gap 7 5 - 15  Vitamin B12  Result Value Ref Range   Vitamin B-12 164 (L) 180 - 914 pg/mL  CBC with Differential/Platelet  Result Value Ref Range   WBC 7.2 4.0 - 10.5 K/uL   RBC 4.78 3.87 - 5.11 MIL/uL   Hemoglobin 14.6 12.0 - 15.0 g/dL   HCT 85.4 36 - 46 %   MCV 89.5 80.0 - 100.0 fL   MCH 30.5 26.0 - 34.0 pg   MCHC 34.1 30.0 - 36.0 g/dL   RDW 62.7 03.5 - 00.9 %   Platelets 169 150 - 400 K/uL   nRBC 0.0 0.0 - 0.2 %   Neutrophils Relative % 58 %   Neutro  Abs 4.2 1.7 - 7.7 K/uL   Lymphocytes Relative 32 %   Lymphs Abs 2.3 0.7 - 4.0 K/uL   Monocytes Relative 7 %   Monocytes Absolute 0.5 0.1 - 1.0 K/uL   Eosinophils Relative 2 %   Eosinophils Absolute 0.2 0.0 - 0.5 K/uL   Basophils Relative 1 %   Basophils Absolute 0.0 0.0 -  0.1 K/uL   Immature Granulocytes 0 %   Abs Immature Granulocytes 0.02 0.00 - 0.07 K/uL      Assessment & Plan:   Problem List Items Addressed This Visit      Other   Lump of right breast - Primary    Right breast lump at 9 o'clock position slightly to left of nipple, tender, approx 5cm in length      Relevant Orders   MM DIAG BREAST TOMO BILATERAL   Family history of breast cancer    Interested in genetic testing, discussed referral to OB/GYN, patient interested, referral placed.      Relevant Orders   Ambulatory referral to Obstetrics / Gynecology      No orders of the defined types were placed in this encounter.   Follow up plan: No follow-ups on file.   Charlaine Dalton, FNP Family Nurse Practitioner [facility] 12/11/2019, 2:41 PM

## 2019-12-11 NOTE — Assessment & Plan Note (Signed)
Right breast lump at 9 o'clock position slightly to left of nipple, tender, approx 5cm in length

## 2019-12-11 NOTE — Patient Instructions (Signed)
I have put in an order for a diagnostic mammogram.  You should receive a call from the clinic to have this scheduled within the next week.  You can also call them to schedule  For Mammogram appointment  Call the Imaging Center below anytime to schedule your own appointment now that order has been placed.  [facility] [address] [place], [place] [address] Phone: [phone]  [facility] Outpatient Radiology [address] [place], [place] [address] Phone: [phone]  You will receive a survey after today's visit either digitally by e-mail or paper by USPS mail. Your experiences and feedback matter to [place].  Please respond so we know how we are doing as we provide care for you.  Call us with any questions/concerns/needs.  It is my goal to be available to you for your health concerns.  Thanks for choosing me to be a partner in your healthcare needs!  Charlaine Dalton, FNP-C Family Nurse Practitioner [facility] Phone: [phone]

## 2019-12-14 ENCOUNTER — Other Ambulatory Visit: Payer: Self-pay | Admitting: Family Medicine

## 2019-12-14 DIAGNOSIS — N6310 Unspecified lump in the right breast, unspecified quadrant: Secondary | ICD-10-CM

## 2019-12-15 ENCOUNTER — Telehealth: Payer: Self-pay | Admitting: Family Medicine

## 2019-12-15 NOTE — Telephone Encounter (Signed)
Patient called to check the status of her referral for a diagnostic mammogram.  Please call patient to discuss at [phone]

## 2019-12-16 ENCOUNTER — Encounter: Payer: Self-pay | Admitting: Obstetrics and Gynecology

## 2019-12-21 ENCOUNTER — Ambulatory Visit: Payer: PRIVATE HEALTH INSURANCE

## 2019-12-22 ENCOUNTER — Inpatient Hospital Stay: Payer: BLUE CROSS/BLUE SHIELD | Attending: Oncology

## 2019-12-28 ENCOUNTER — Other Ambulatory Visit: Payer: Self-pay

## 2019-12-30 ENCOUNTER — Inpatient Hospital Stay: Payer: BLUE CROSS/BLUE SHIELD | Attending: Oncology

## 2019-12-31 ENCOUNTER — Inpatient Hospital Stay: Payer: BLUE CROSS/BLUE SHIELD

## 2019-12-31 ENCOUNTER — Inpatient Hospital Stay: Payer: BLUE CROSS/BLUE SHIELD | Admitting: Oncology

## 2020-01-07 ENCOUNTER — Other Ambulatory Visit: Payer: Self-pay

## 2020-01-07 ENCOUNTER — Ambulatory Visit
Admission: RE | Admit: 2020-01-07 | Discharge: 2020-01-07 | Disposition: A | Discharge status: Home / Self Care | Payer: BLUE CROSS/BLUE SHIELD | Source: Ambulatory Visit | Attending: Family Medicine | Admitting: Family Medicine

## 2020-01-07 DIAGNOSIS — N6310 Unspecified lump in the right breast, unspecified quadrant: Secondary | ICD-10-CM | POA: Diagnosis not present

## 2020-01-07 DIAGNOSIS — N6489 Other specified disorders of breast: Secondary | ICD-10-CM | POA: Diagnosis not present

## 2020-01-07 DIAGNOSIS — R928 Other abnormal and inconclusive findings on diagnostic imaging of breast: Secondary | ICD-10-CM | POA: Diagnosis not present

## 2020-03-22 ENCOUNTER — Telehealth: Payer: Self-pay

## 2020-03-22 DIAGNOSIS — Z1231 Encounter for screening mammogram for malignant neoplasm of breast: Secondary | ICD-10-CM

## 2020-03-22 DIAGNOSIS — Z803 Family history of malignant neoplasm of breast: Secondary | ICD-10-CM

## 2020-03-22 NOTE — Telephone Encounter (Signed)
Copied from CRM [id number]. Topic: Referral - Request for Referral >> Mar 21, 2020  2:24 PM Randol Kern wrote: Has patient seen PCP for this complaint? No. *If NO, is insurance requiring patient see PCP for this issue before PCP can refer them? Referral for which specialty: Mammography  Preferred provider/office: [facility] Reason for referral: needs mammogram

## 2020-03-29 ENCOUNTER — Encounter: Payer: Self-pay | Admitting: Unknown Physician Specialty

## 2020-05-16 ENCOUNTER — Other Ambulatory Visit: Payer: Self-pay

## 2020-05-16 ENCOUNTER — Encounter: Payer: Self-pay | Admitting: Family Medicine

## 2020-05-16 ENCOUNTER — Ambulatory Visit (INDEPENDENT_AMBULATORY_CARE_PROVIDER_SITE_OTHER): Payer: BLUE CROSS/BLUE SHIELD | Admitting: Family Medicine

## 2020-05-16 VITALS — BP 134/82 | HR 65 | Ht 65.0 in | Wt 196.2 lb

## 2020-05-16 DIAGNOSIS — F331 Major depressive disorder, recurrent, moderate: Secondary | ICD-10-CM | POA: Diagnosis not present

## 2020-05-16 DIAGNOSIS — E538 Deficiency of other specified B group vitamins: Secondary | ICD-10-CM

## 2020-05-16 DIAGNOSIS — Z1231 Encounter for screening mammogram for malignant neoplasm of breast: Secondary | ICD-10-CM

## 2020-05-16 DIAGNOSIS — F411 Generalized anxiety disorder: Secondary | ICD-10-CM

## 2020-05-16 DIAGNOSIS — Z0000 Encounter for general adult medical examination without abnormal findings: Secondary | ICD-10-CM

## 2020-05-16 DIAGNOSIS — R7309 Other abnormal glucose: Secondary | ICD-10-CM

## 2020-05-16 DIAGNOSIS — F410 Panic disorder [episodic paroxysmal anxiety] without agoraphobia: Secondary | ICD-10-CM

## 2020-05-16 DIAGNOSIS — Z9884 Bariatric surgery status: Secondary | ICD-10-CM

## 2020-05-16 NOTE — Patient Instructions (Addendum)
Thank you for coming to the office today.  Please follow-up with Rene Kocher within 2 weeks on the ADHD and mental health concerns as well as pap smear.  For Mammogram screening for breast cancer   Call the Imaging Center below anytime to schedule your own appointment now that order has been placed.  [facility] [address] [place], [place] [address] Phone: [phone]   DUE for FASTING BLOOD WORK (no food or drink after midnight before the lab appointment, only water or coffee without cream/sugar on the morning of)  SCHEDULE "Lab Only" visit in the morning at the clinic for lab draw in 1 day 815am  For Lab Results, once available within 2-3 days of blood draw, you can can log in to MyChart online to view your results and a brief explanation.   Please schedule a Follow-up Appointment to: Return in about 1 day (around 05/17/2020) for return on Tues 4/19 8:15am for fasting lab - return 2 weeks for Pap Smear + ADHD eval w/ Rene Kocher.  If you have any other questions or concerns, please feel free to call the office or send a message through MyChart. You may also schedule an earlier appointment if necessary.  Additionally, you may be receiving a survey about your experience at our office within a few days to 1 week by e-mail or mail. We value your feedback.  Saralyn Pilar, DO [facility], [place]

## 2020-05-16 NOTE — Progress Notes (Signed)
Subjective:    Patient ID: [name], female    DOB: 03-Sep-1975, 45 y.o.   MRN: [id number]  [name] is a 45 y.o. female presenting on 05/16/2020 for Annual Exam  Previous PCP Danielle Rankin, FNP   HPI   Here for wellness / physical evaluation and due for lab panel. She has eaten today and will return within 1 day approximately for fasting lab panel.  Last labs done 2020 and 2021. She does not have history of abnormal cholesterol. She has history of Vitamin B12 deficiency asks about checking this.  Major Depression, recurrent moderate Generalized Anxiety Disorder Previously on Escitalopram 20mg  daily SSRI, however says had mixed results, some improved mood/anxiety but did not resolve the problem, she had run out of this in past 6 months and has not been on it since. Was overdue for refills. She is not interested in restarting this medication today.  Concern with Adult onset ADHD problem, no previous formal diagnosis or testing. Never been on medications regularly for this. Difficulty with focusing and concentrating and mood swings impacting her function more significantly lately. Past medications - She has tried Paxil, Wellbutrin, Zoloft, Effexor, Buspar, Escitalopram - admits may be other meds Admits lot of stressors. Does not have a good support system right now. Not interested in therapy / psychiatry referrals.  Health Maintenance:  Tdap in future.  Routine Hep C and HIV screening done 10/08/14, both negative.  Prior Hysterectomy, but still has cervix intact. Last Pap smear 2018, negative. Will return for pap when ready.  Due Mammogram needs order. Last had follow-up imaging 12/2019, next routine is due 03/2020 3D mammo.  Depression screen [facility] 2/9 05/16/2020 12/11/2019 12/09/2018  Decreased Interest 2 1 3   Down, Depressed, Hopeless 3 1 3   PHQ - 2 Score 5 2 6   Altered sleeping 3 3 2   Tired, decreased energy 3 3 3   Change in appetite 2 3 3   Feeling bad or  failure about yourself  2 1 2   Trouble concentrating 3 3 3   Moving slowly or fidgety/restless 2 0 0  Suicidal thoughts 2 0 1  PHQ-9 Score 22 15 20   Difficult doing work/chores Very difficult Somewhat difficult Extremely dIfficult   GAD 7 : Generalized Anxiety Score 05/16/2020 12/11/2019 12/09/2018  Nervous, Anxious, on Edge 1 2 1   Control/stop worrying 3 2 1   Worry too much - different things 3 2 1   Trouble relaxing 3 3 1   Restless 2 0 2  Easily annoyed or irritable 3 3 1   Afraid - awful might happen 1 0 1  Total GAD 7 Score 16 12 8   Anxiety Difficulty Somewhat difficult Somewhat difficult Extremely difficult     Past Medical History:  Diagnosis Date  . Anemia   . Anxiety   . Chronic headaches   . Depression   . Quit smoking 06/29/2016   Past Surgical History:  Procedure Laterality Date  . ABDOMINAL HYSTERECTOMY     partial  . bartholins cyst removal  2017  . BREAST BIOPSY Right 2008   CORE - NEG  . BREAST EXCISIONAL BIOPSY Right 2010   NEG  . CESAREAN SECTION    . GALLBLADDER SURGERY  2012  . GASTRIC BYPASS  2012  . TUBAL LIGATION    . VAGINAL HYSTERECTOMY  2006   ovaries remain   Social History   Socioeconomic History  . Marital status: Widowed    Spouse name: Not on file  . Number of children: Not  on file  . Years of education: Not on file  . Highest education level: Not on file  Occupational History  . Not on file  Tobacco Use  . Smoking status: Former Smoker    Packs/day: 1.00    Years: 15.00    Pack years: 15.00    Quit date: 2018    Years since quitting: 4.2  . Smokeless tobacco: Former Clinical biochemist  . Vaping Use: Every day  Substance and Sexual Activity  . Alcohol use: Not Currently    Alcohol/week: 0.0 standard drinks  . Drug use: No  . Sexual activity: Yes    Birth control/protection: None  Other Topics Concern  . Not on file  Social History Narrative  . Not on file   Social Determinants of Health   Financial Resource Strain:  Not on file  Food Insecurity: Not on file  Transportation Needs: Not on file  Physical Activity: Not on file  Stress: Not on file  Social Connections: Not on file  Intimate Partner Violence: Not on file   Family History  Problem Relation Age of Onset  . Breast cancer Mother        55'S  . Breast cancer Maternal Grandmother        60'S  . Hypothyroidism Father   . Multiple sclerosis Maternal Aunt   . Multiple sclerosis Maternal Aunt   . Multiple sclerosis Cousin    Current Outpatient Medications on File Prior to Visit  Medication Sig  . cholecalciferol (VITAMIN D3) 25 MCG (1000 UT) tablet Take 2 tablets (2,000 Units total) by mouth daily.  Marland Kitchen escitalopram (LEXAPRO) 20 MG tablet TAKE 1 TABLET (20 MG TOTAL) BY MOUTH DAILY. MAY CUT IN HALF FOR DOSE 10MG  FOR FIRST 2-4 WEEKS, INCREASE TO 1 WHOLE TABLET 20MG  DAILY IF NEEDED (Patient not taking: Reported on 05/16/2020)  . hydrOXYzine (ATARAX/VISTARIL) 10 MG tablet Take 1 tablet (10 mg total) by mouth every 8 (eight) hours as needed for anxiety. (Patient not taking: Reported on 05/16/2020)  . ibuprofen (ADVIL) 600 MG tablet Take 600 mg by mouth every 6 (six) hours as needed.  (Patient not taking: Reported on 05/16/2020)   No current facility-administered medications on file prior to visit.    Review of Systems Per HPI unless specifically indicated above      Objective:    BP (!) 144/87   Pulse 65   Ht 5\' 5"  (1.651 m)   Wt 196 lb 3.2 oz (89 kg)   LMP  (LMP Unknown)   SpO2 100%   BMI 32.65 kg/m   Wt Readings from Last 3 Encounters:  05/16/20 196 lb 3.2 oz (89 kg)  12/11/19 191 lb (86.6 kg)  06/30/19 196 lb 3.2 oz (89 kg)    Physical Exam Vitals and nursing note reviewed.  Constitutional:      General: She is not in acute distress.    Appearance: She is well-developed. She is not diaphoretic.     Comments: Well-appearing, comfortable, cooperative  HENT:     Head: Normocephalic and atraumatic.  Eyes:     General:         Right eye: No discharge.        Left eye: No discharge.     Conjunctiva/sclera: Conjunctivae normal.     Pupils: Pupils are equal, round, and reactive to light.  Neck:     Thyroid: No thyromegaly.  Cardiovascular:     Rate and Rhythm: Normal rate and regular rhythm.  Heart sounds: Normal heart sounds. No murmur heard.   Pulmonary:     Effort: Pulmonary effort is normal. No respiratory distress.     Breath sounds: Normal breath sounds. No wheezing or rales.  Abdominal:     General: Bowel sounds are normal. There is no distension.     Palpations: Abdomen is soft. There is no mass.     Tenderness: There is no abdominal tenderness.  Musculoskeletal:        General: No tenderness. Normal range of motion.     Cervical back: Normal range of motion and neck supple.     Right lower leg: No edema.     Left lower leg: No edema.     Comments: Upper / Lower Extremities: - Normal muscle tone, strength bilateral upper extremities 5/5, lower extremities 5/5  Lymphadenopathy:     Cervical: No cervical adenopathy.  Skin:    General: Skin is warm and dry.     Findings: No erythema or rash.  Neurological:     Mental Status: She is alert and oriented to person, place, and time.     Comments: Distal sensation intact to light touch all extremities  Psychiatric:        Behavior: Behavior normal.     Comments: Well groomed, good eye contact, normal speech and thoughts      CLINICAL DATA:  Approximately 1 cm mass felt by the patient in the outer right breast for the past 3 weeks.  EXAM: DIGITAL DIAGNOSTIC RIGHT MAMMOGRAM WITH CAD AND TOMO  ULTRASOUND RIGHT BREAST  COMPARISON:  Previous exam(s).  ACR Breast Density Category b: There are scattered areas of fibroglandular density.  FINDINGS: Stable mammographic appearance of the right breast with no mammographically visible mass or other findings suspicious malignancy. Specifically, no abnormality seen at the location of the mass  felt by the patient, marked with a metallic marker.  Mammographic images were processed with CAD.  On physical exam, there is an approximately 1 cm rounded area of mild palpable soft tissue thickening in the lateral right breast at the location of patient concern. This is at the superior aspect of the patient's scar from an excisional biopsy performed in 2010. There is an indentation at the location of the surgical scar.  Targeted ultrasound is performed, showing a surgical in the lower outer right breast. Normal appearing breast tissue is demonstrated in the remainder of the outer right breast. This includes normal fibroglandular tissue with a convex anterior margin at the location of the area of palpable concern. No mass or other findings suspicious for malignancy were seen.  IMPRESSION: No evidence of malignancy.  RECOMMENDATION: Bilateral screening mammogram in 2 months when due. That will be 1 year since mammographic evaluation of the left breast.  I have discussed the findings and recommendations with the patient. If applicable, a reminder letter will be sent to the patient regarding the next appointment.  BI-RADS CATEGORY  2: Benign.   Electronically Signed   By: Beckie Salts M.D.   On: 01/07/2020 14:13   Results for orders placed or performed in visit on 05/16/20  [facility] HIV SCREENING LAB  Result Value Ref Range   HM HIV Screening Negative - Validated   HM HEPATITIS C SCREENING LAB  Result Value Ref Range   HM Hepatitis Screen Negative-Validated       Assessment & Plan:   Problem List Items Addressed This Visit    Vitamin B12 deficiency   Relevant Orders   Vitamin  B12   Major depressive disorder, recurrent, moderate (HCC)   Relevant Orders   COMPLETE METABOLIC PANEL WITH GFR   Generalized anxiety disorder with panic attacks    Other Visit Diagnoses    Annual physical exam    -  Primary   Relevant Orders   Hemoglobin A1c   CBC with  Differential/Platelet   COMPLETE METABOLIC PANEL WITH GFR   Lipid panel   TSH   Encounter for screening mammogram for malignant neoplasm of breast       Relevant Orders   MM 3D SCREEN BREAST BILATERAL   Abnormal glucose       Relevant Orders   Hemoglobin A1c   S/P gastric bypass       Relevant Orders   COMPLETE METABOLIC PANEL WITH GFR   Lipid panel      Updated Health Maintenance information - ordered 3D Mammogram, she can call [place] to schedule anytime Due for fasting labs, orders in, return 1-2 days approx for lab panel Encouraged improvement to lifestyle with diet and exercise Goal of weight loss  Due to return for pap smear + routine screening STD GC/Chlamydia swab when ready, will return w/ new PCP female provider for pap smear. Or she may contact GYN and schedule for routine pap through their office. Offered both options today.  #Vitamin B12 deficiency Check lab.  #Anxiety/Depression - recurrent moderate #Difficulty Focusing - possible ADHD Chronic history of mixed complicated mood/anxiety disorder Multiple prior medication treatment failures Discussion today that this is one of her primary concerns right now, emphasized to her that this is not the focus of the annual physical today, and typically we recommend patients get formally tested / evaluated by mental health professional / therapist for ADHD first, and we are then able to better come up with a treatment plan. Especially in cases with mixed mood/anxiety component, those problems can be causing her focus or inattention instead, or in conjunction with ADHD. Reviewed list of prior meds, and offered to restart Escitalopram or something similar that she may have not tried before, ultimately she reports that she would not like to restart Escitalopram and not be interested to see Psychiatry or therapist at this time for ADHD testing and evaluation.  No orders of the defined types were placed in this  encounter.     Follow up plan: Return in about 1 day (around 05/17/2020) for return on Tues 4/19 8:15am for fasting lab.  Saralyn Pilar, DO [facility] 05/16/2020, 11:00 AM

## 2020-06-20 ENCOUNTER — Other Ambulatory Visit: Payer: Self-pay

## 2020-06-20 ENCOUNTER — Ambulatory Visit
Admission: RE | Admit: 2020-06-20 | Discharge: 2020-06-20 | Disposition: A | Discharge status: Home / Self Care | Payer: BLUE CROSS/BLUE SHIELD | Source: Ambulatory Visit | Attending: Family Medicine | Admitting: Family Medicine

## 2020-06-20 DIAGNOSIS — Z1231 Encounter for screening mammogram for malignant neoplasm of breast: Secondary | ICD-10-CM | POA: Insufficient documentation

## 2021-05-24 ENCOUNTER — Encounter: Payer: Self-pay | Admitting: Oncology

## 2021-05-31 ENCOUNTER — Encounter: Payer: BLUE CROSS/BLUE SHIELD | Admitting: Internal Medicine

## 2021-05-31 NOTE — Progress Notes (Deleted)
Subjective:    Patient ID: [name], female    DOB: 14-Oct-1975, 46 y.o.   MRN: [id number]  HPI  Patient presents to clinic today for her annual exam.  She is also due to follow-up chronic conditions  Iron Deficiency Anemia: Her last H/H was 14.6/42.8, 05/2019.  She is not currently taking any oral iron or getting iron infusions at this time.  She does not follow with hematology  Anxiety and depression: Currently not an issue.  She is not taking any medications for this.  She is not seeing a therapist.  She denies SI/HI.  Flu: 11/2017 Tetanus: COVID: Pap smear: 06/2016, hysterectomy Mammogram: 05/2020 Vision screening: Dentist:  Diet: Exercise:  Review of Systems     Past Medical History:  Diagnosis Date   Anemia    Anxiety    Chronic headaches    Depression    Quit smoking 06/29/2016    Current Outpatient Medications  Medication Sig Dispense Refill   cholecalciferol (VITAMIN D3) 25 MCG (1000 UT) tablet Take 2 tablets (2,000 Units total) by mouth daily. 60 tablet 3   ibuprofen (ADVIL) 600 MG tablet Take 600 mg by mouth every 6 (six) hours as needed.  (Patient not taking: Reported on 05/16/2020)     No current facility-administered medications for this visit.    Allergies  Allergen Reactions   Morphine Itching    Family History  Problem Relation Age of Onset   Breast cancer Mother        32'S   Breast cancer Maternal Grandmother        60'S   Hypothyroidism Father    Multiple sclerosis Maternal Aunt    Multiple sclerosis Maternal Aunt    Multiple sclerosis Cousin     Social History   Socioeconomic History   Marital status: Widowed    Spouse name: Not on file   Number of children: Not on file   Years of education: Not on file   Highest education level: Not on file  Occupational History   Not on file  Tobacco Use   Smoking status: Former    Packs/day: 1.00    Years: 15.00    Pack years: 15.00    Types: Cigarettes    Quit date: 2018     Years since quitting: 5.3   Smokeless tobacco: Former  Scientific laboratory technician Use: Every day  Substance and Sexual Activity   Alcohol use: Not Currently    Alcohol/week: 0.0 standard drinks   Drug use: No   Sexual activity: Yes    Birth control/protection: None  Other Topics Concern   Not on file  Social History Narrative   Not on file   Social Determinants of Health   Financial Resource Strain: Not on file  Food Insecurity: Not on file  Transportation Needs: Not on file  Physical Activity: Not on file  Stress: Not on file  Social Connections: Not on file  Intimate Partner Violence: Not on file     Constitutional: Denies fever, malaise, fatigue, headache or abrupt weight changes.  HEENT: Denies eye pain, eye redness, ear pain, ringing in the ears, wax buildup, runny nose, nasal congestion, bloody nose, or sore throat. Respiratory: Denies difficulty breathing, shortness of breath, cough or sputum production.   Cardiovascular: Denies chest pain, chest tightness, palpitations or swelling in the hands or feet.  Gastrointestinal: Denies abdominal pain, bloating, constipation, diarrhea or blood in the stool.  GU: Denies urgency, frequency, pain with urination, burning  sensation, blood in urine, odor or discharge. Musculoskeletal: Denies decrease in range of motion, difficulty with gait, muscle pain or joint pain and swelling.  Skin: Denies redness, rashes, lesions or ulcercations.  Neurological: Denies dizziness, difficulty with memory, difficulty with speech or problems with balance and coordination.  Psych: Patient has a history of anxiety and depression.  Denies SI/HI.  No other specific complaints in a complete review of systems (except as listed in HPI above).  Objective:   Physical Exam   LMP  (LMP Unknown)  Wt Readings from Last 3 Encounters:  05/16/20 196 lb 3.2 oz (89 kg)  12/11/19 191 lb (86.6 kg)  06/30/19 196 lb 3.2 oz (89 kg)    General: Appears their stated  age, well developed, well nourished in NAD. Skin: Warm, dry and intact. No rashes, lesions or ulcerations noted. HEENT: Head: normal shape and size; Eyes: sclera white, no icterus, conjunctiva pink, PERRLA and EOMs intact; Ears: Tm's gray and intact, normal light reflex; Nose: mucosa pink and moist, septum midline; Throat/Mouth: Teeth present, mucosa pink and moist, no exudate, lesions or ulcerations noted.  Neck:  Neck supple, trachea midline. No masses, lumps or thyromegaly present.  Cardiovascular: Normal rate and rhythm. S1,S2 noted.  No murmur, rubs or gallops noted. No JVD or BLE edema. No carotid bruits noted. Pulmonary/Chest: Normal effort and positive vesicular breath sounds. No respiratory distress. No wheezes, rales or ronchi noted.  Abdomen: Soft and nontender. Normal bowel sounds. No distention or masses noted. Liver, spleen and kidneys non palpable. Musculoskeletal: Normal range of motion. No signs of joint swelling. No difficulty with gait.  Neurological: Alert and oriented. Cranial nerves II-XII grossly intact. Coordination normal.  Psychiatric: Mood and affect normal. Behavior is normal. Judgment and thought content normal.     BMET    Component Value Date/Time   NA 138 06/26/2019 1340   NA 140 09/15/2012 1631   K 4.1 06/26/2019 1340   K 3.9 09/15/2012 1631   CL 105 06/26/2019 1340   CL 108 (H) 09/15/2012 1631   CO2 26 06/26/2019 1340   CO2 29 09/15/2012 1631   GLUCOSE 103 (H) 06/26/2019 1340   GLUCOSE 103 (H) 09/15/2012 1631   BUN 18 06/26/2019 1340   BUN 9 09/15/2012 1631   CREATININE 0.86 06/26/2019 1340   CREATININE 0.70 12/06/2017 0813   CALCIUM 9.2 06/26/2019 1340   CALCIUM 8.6 09/15/2012 1631   GFRNONAA >60 06/26/2019 1340   GFRNONAA 108 12/06/2017 0813   GFRAA >60 06/26/2019 1340   GFRAA 126 12/06/2017 0813    Lipid Panel     Component Value Date/Time   CHOL 175 12/11/2018 0832   TRIG 43 12/11/2018 0832   HDL 80 12/11/2018 0832   CHOLHDL 2.2  12/11/2018 0832   LDLCALC 83 12/11/2018 0832    CBC    Component Value Date/Time   WBC 7.2 06/26/2019 1340   RBC 4.78 06/26/2019 1340   HGB 14.6 06/26/2019 1340   HGB 14.3 09/15/2012 1631   HCT 42.8 06/26/2019 1340   HCT 41.3 09/15/2012 1631   PLT 169 06/26/2019 1340   PLT 154 09/15/2012 1631   MCV 89.5 06/26/2019 1340   MCV 89 09/15/2012 1631   MCH 30.5 06/26/2019 1340   MCHC 34.1 06/26/2019 1340   RDW 12.4 06/26/2019 1340   RDW 13.1 09/15/2012 1631   LYMPHSABS 2.3 06/26/2019 1340   LYMPHSABS 3.7 (H) 07/05/2011 0504   MONOABS 0.5 06/26/2019 1340   MONOABS 0.4 07/05/2011 0504  EOSABS 0.2 06/26/2019 1340   EOSABS 0.2 07/05/2011 0504   BASOSABS 0.0 06/26/2019 1340   BASOSABS 0.0 07/05/2011 0504    Hgb A1C Lab Results  Component Value Date   HGBA1C 5.2 12/11/2018           Assessment & Plan:   Preventative Health Maintenance:  Encouraged her to get a flu shot in fall Tetanus She no longer needs Pap smears  Mammogram ordered-she will call to schedule Encouraged her to consume a balanced diet and exercise regimen Advised her to see an eye doctor and dentist annually We will check CBC, c-Met, lipid, A1c today  RTC in 1 year, sooner if needed Webb Silversmith, NP

## 2021-06-19 ENCOUNTER — Encounter: Payer: Self-pay | Admitting: Oncology

## 2021-06-22 ENCOUNTER — Other Ambulatory Visit (HOSPITAL_COMMUNITY)
Admission: RE | Admit: 2021-06-22 | Discharge: 2021-06-22 | Disposition: A | Discharge status: Home / Self Care | Payer: 59 | Source: Ambulatory Visit | Attending: Obstetrics and Gynecology | Admitting: Obstetrics and Gynecology

## 2021-06-22 ENCOUNTER — Encounter: Payer: Self-pay | Admitting: Obstetrics and Gynecology

## 2021-06-22 ENCOUNTER — Ambulatory Visit (INDEPENDENT_AMBULATORY_CARE_PROVIDER_SITE_OTHER): Payer: 59 | Admitting: Obstetrics and Gynecology

## 2021-06-22 VITALS — BP 129/86 | HR 65 | Resp 16 | Ht 65.0 in | Wt 201.5 lb

## 2021-06-22 DIAGNOSIS — Z202 Contact with and (suspected) exposure to infections with a predominantly sexual mode of transmission: Secondary | ICD-10-CM | POA: Insufficient documentation

## 2021-06-22 DIAGNOSIS — Z124 Encounter for screening for malignant neoplasm of cervix: Secondary | ICD-10-CM | POA: Diagnosis not present

## 2021-06-22 DIAGNOSIS — Z1231 Encounter for screening mammogram for malignant neoplasm of breast: Secondary | ICD-10-CM

## 2021-06-22 DIAGNOSIS — Z01419 Encounter for gynecological examination (general) (routine) without abnormal findings: Secondary | ICD-10-CM

## 2021-06-22 DIAGNOSIS — N951 Menopausal and female climacteric states: Secondary | ICD-10-CM

## 2021-06-22 DIAGNOSIS — N8189 Other female genital prolapse: Secondary | ICD-10-CM

## 2021-06-22 NOTE — Progress Notes (Signed)
HPI:      [name] is a 46 y.o. No obstetric history on file. who LMP was No LMP recorded (lmp unknown). Patient has had a hysterectomy.  Subjective:   She presents today for her annual examination.  She states that she has noticed some small bumps on her left labia recently.  They do not itch or burn or cause any other symptoms. She reports that she has had abnormal cells on her Pap smear in the past but has since had a hysterectomy for endometriosis. She has some concerns about a previous sexual partner and would like to be tested for "vaginal STDs". Patient states that she has begun having menopausal symptoms that include hot flashes and night sweats mood changes weight gain etc. She is due for mammography and blood work.    Hx: The following portions of the patient's history were reviewed and updated as appropriate:             She  has a past medical history of Anemia, Anxiety, Chronic headaches, Depression, and Quit smoking (06/29/2016). She does not have any pertinent problems on file. She  has a past surgical history that includes Gallbladder surgery (2012); Gastric bypass (2012); Vaginal hysterectomy (2006); Tubal ligation; Cesarean section; Breast biopsy (Right, 2008); bartholins cyst removal (2017); Abdominal hysterectomy; and Breast excisional biopsy (Right, 2010). Her family history includes Breast cancer in her maternal grandmother and mother; Hypothyroidism in her father; Multiple sclerosis in her cousin, maternal aunt, and maternal aunt. She  reports that she quit smoking about 5 years ago. She has a 15.00 pack-year smoking history. She has quit using smokeless tobacco. She reports that she does not currently use alcohol. She reports that she does not use drugs. She has a current medication list which includes the following prescription(s): cholecalciferol. She is allergic to morphine.       Review of Systems:  Review of Systems  Constitutional: Denied  constitutional symptoms, night sweats, recent illness, fatigue, fever, insomnia and weight loss.  Eyes: Denied eye symptoms, eye pain, photophobia, vision change and visual disturbance.  Ears/Nose/Throat/Neck: Denied ear, nose, throat or neck symptoms, hearing loss, nasal discharge, sinus congestion and sore throat.  Cardiovascular: Denied cardiovascular symptoms, arrhythmia, chest pain/pressure, edema, exercise intolerance, orthopnea and palpitations.  Respiratory: Denied pulmonary symptoms, asthma, pleuritic pain, productive sputum, cough, dyspnea and wheezing.  Gastrointestinal: Denied, gastro-esophageal reflux, melena, nausea and vomiting.  Genitourinary: See HPI for additional information.  Musculoskeletal: Denied musculoskeletal symptoms, stiffness, swelling, muscle weakness and myalgia.  Dermatologic: Denied dermatology symptoms, rash and scar.  Neurologic: Denied neurology symptoms, dizziness, headache, neck pain and syncope.  Psychiatric: Denied psychiatric symptoms, anxiety and depression.  Endocrine: Denied endocrine symptoms including hot flashes and night sweats.   Meds:   Current Outpatient Medications on File Prior to Visit  Medication Sig Dispense Refill   cholecalciferol (VITAMIN D3) 25 MCG (1000 UT) tablet Take 2 tablets (2,000 Units total) by mouth daily. 60 tablet 3   No current facility-administered medications on file prior to visit.     Objective:     Vitals:   06/22/21 1006  BP: 129/86  Pulse: 65  Resp: 16    Filed Weights   06/22/21 1006  Weight: 201 lb 8 oz (91.4 kg)              Physical examination General NAD, Conversant  HEENT Atraumatic; Op clear with mmm.  Normo-cephalic. Pupils reactive. Anicteric sclerae  Thyroid/Neck Smooth without nodularity or enlargement. Normal  ROM.  Neck Supple.  Skin No rashes, lesions or ulceration. Normal palpated skin turgor. No nodularity.  Breasts: No masses or discharge.  Symmetric.  No axillary adenopathy.   Lungs: Clear to auscultation.No rales or wheezes. Normal Respiratory effort, no retractions.  Heart: NSR.  No murmurs or rubs appreciated. No periferal edema  Abdomen: Soft.  Non-tender.  No masses.  No HSM. No hernia  Extremities: Moves all appropriately.  Normal ROM for age. No lymphadenopathy.  Neuro: Oriented to PPT.  Normal mood. Normal affect.     Pelvic:   Vulva: Normal appearance.  No lesions.  Small flat appearing lesion on the upper left labia possibly consistent with flat condyloma.  Vagina: No lesions or abnormalities noted.  Support: Third-degree rectocele second-degree cystocele  Urethra No masses tenderness or scarring.  Meatus Normal size without lesions or prolapse.  Cervix: Surgically absent  Anus: Normal exam.  No lesions.  Perineum: Normal exam.  No lesions.        Bimanual   Uterus: Surgically absent  Adnexae: No masses.  Non-tender to palpation.  Cul-de-sac: Negative for abnormality.     Assessment:    No obstetric history on file. Patient Active Problem List   Diagnosis Date Noted   Iron deficiency anemia 12/19/2017   Anxiety and depression 07/26/2016     1. Well woman exam with routine gynecological exam   2. Cervical cancer screening   3. Screening mammogram for breast cancer   4. Possible exposure to STD   5. Encounter for screening mammogram for malignant neoplasm of breast   6. Symptomatic menopausal or female climacteric states   7. Pelvic relaxation disorder        Plan:            1.  Basic Screening Recommendations The basic screening recommendations for asymptomatic women were discussed with the patient during her visit.  The age-appropriate recommendations were discussed with her and the rational for the tests reviewed.  When I am informed by the patient that another primary care physician has previously obtained the age-appropriate tests and they are up-to-date, only outstanding tests are ordered and referrals given as necessary.   Abnormal results of tests will be discussed with her when all of her results are completed.  Routine preventative health maintenance measures emphasized: Exercise/Diet/Weight control, Tobacco Warnings, Alcohol/Substance use risks and Stress Management Pap ordered-blood work ordered 2.  [place] because patient having significant menopausal symptoms  If elevated consider discussion regarding ERT 3.  [place] performed at patient request  Orders Orders Placed This Encounter  Procedures   MM 3D SCREEN BREAST BILATERAL   Basic metabolic panel   CBC   Cholesterol, total   Hemoglobin A1c   Lipid panel   TSH   Follicle stimulating hormone    No orders of the defined types were placed in this encounter.         F/U  Return in about 1 year (around 06/23/2022) for We will contact her with any abnormal test results, Annual Physical.  Finis Bud, M.D. 06/22/2021 11:03 AM

## 2021-06-23 ENCOUNTER — Encounter: Payer: Self-pay | Admitting: Oncology

## 2021-06-23 LAB — TSH: TSH: 0.797 u[IU]/mL (ref 0.450–4.500)

## 2021-06-23 LAB — BASIC METABOLIC PANEL
BUN/Creatinine Ratio: 14 (ref 9–23)
BUN: 10 mg/dL (ref 6–24)
CO2: 27 mmol/L (ref 20–29)
Calcium: 9.5 mg/dL (ref 8.7–10.2)
Chloride: 100 mmol/L (ref 96–106)
Creatinine, Ser: 0.69 mg/dL (ref 0.57–1.00)
Glucose: 91 mg/dL (ref 70–99)
Potassium: 4.5 mmol/L (ref 3.5–5.2)
Sodium: 137 mmol/L (ref 134–144)
eGFR: 110 mL/min/{1.73_m2} (ref >59)

## 2021-06-23 LAB — CBC
Hematocrit: 40.6 % (ref 34.0–46.6)
Hemoglobin: 14.1 g/dL (ref 11.1–15.9)
MCH: 30.7 pg (ref 26.6–33.0)
MCHC: 34.7 g/dL (ref 31.5–35.7)
MCV: 88 fL (ref 79–97)
Platelets: 159 10*3/uL (ref 150–450)
RBC: 4.6 x10E6/uL (ref 3.77–5.28)
RDW: 12.7 % (ref 11.7–15.4)
WBC: 6.7 10*3/uL (ref 3.4–10.8)

## 2021-06-23 LAB — FOLLICLE STIMULATING HORMONE: FSH: 70 m[IU]/mL

## 2021-06-23 LAB — HEMOGLOBIN A1C
Est. average glucose Bld gHb Est-mCnc: 117 mg/dL
Hgb A1c MFr Bld: 5.7 % — ABNORMAL HIGH (ref 4.8–5.6)

## 2021-06-27 LAB — CERVICOVAGINAL ANCILLARY ONLY
Bacterial Vaginitis (gardnerella): POSITIVE — AB
Candida Glabrata: NEGATIVE
Candida Vaginitis: NEGATIVE
Chlamydia: NEGATIVE
Comment: NEGATIVE
Comment: NEGATIVE
Comment: NEGATIVE
Comment: NEGATIVE
Comment: NEGATIVE
Comment: NORMAL
Neisseria Gonorrhea: NEGATIVE
Trichomonas: NEGATIVE

## 2021-06-28 ENCOUNTER — Other Ambulatory Visit: Payer: Self-pay

## 2021-06-28 DIAGNOSIS — B9689 Other specified bacterial agents as the cause of diseases classified elsewhere: Secondary | ICD-10-CM

## 2021-06-28 MED ORDER — METRONIDAZOLE 500 MG PO TABS: 500.0000 mg | ORAL_TABLET | Freq: Two times a day (BID) | ORAL | 0 refills | Status: DC

## 2021-06-29 ENCOUNTER — Ambulatory Visit
Admission: RE | Admit: 2021-06-29 | Discharge: 2021-06-29 | Disposition: A | Discharge status: Home / Self Care | Payer: 59 | Source: Ambulatory Visit | Attending: Obstetrics and Gynecology | Admitting: Obstetrics and Gynecology

## 2021-06-29 DIAGNOSIS — Z1231 Encounter for screening mammogram for malignant neoplasm of breast: Secondary | ICD-10-CM | POA: Insufficient documentation

## 2021-06-29 DIAGNOSIS — Z01419 Encounter for gynecological examination (general) (routine) without abnormal findings: Secondary | ICD-10-CM | POA: Diagnosis present

## 2021-07-05 ENCOUNTER — Ambulatory Visit (INDEPENDENT_AMBULATORY_CARE_PROVIDER_SITE_OTHER): Payer: 59 | Admitting: Obstetrics and Gynecology

## 2021-07-05 ENCOUNTER — Encounter: Payer: Self-pay | Admitting: Obstetrics and Gynecology

## 2021-07-05 VITALS — BP 128/78 | HR 72 | Ht 65.0 in | Wt 205.3 lb

## 2021-07-05 DIAGNOSIS — N951 Menopausal and female climacteric states: Secondary | ICD-10-CM

## 2021-07-05 DIAGNOSIS — R7989 Other specified abnormal findings of blood chemistry: Secondary | ICD-10-CM

## 2021-07-05 MED ORDER — ESTRADIOL 2 MG PO TABS: 2.0000 mg | ORAL_TABLET | Freq: Every day | ORAL | 3 refills | Status: DC

## 2021-07-05 NOTE — Progress Notes (Signed)
HPI:      [name] is a 46 y.o. No obstetric history on file. who LMP was No LMP recorded (lmp unknown). Patient has had a hysterectomy.  Subjective:   She presents today with menopausal symptoms of hot flashes and difficulty sleeping.  She was found to be in early menopause at the age of 63.  She has previously had a hysterectomy.  She presents today to discuss the possibility of ERT.    Hx: The following portions of the patient's history were reviewed and updated as appropriate:             She  has a past medical history of Anemia, Anxiety, Chronic headaches, Depression, and Quit smoking (06/29/2016). She does not have any pertinent problems on file. She  has a past surgical history that includes Gallbladder surgery (2012); Gastric bypass (2012); Vaginal hysterectomy (2006); Tubal ligation; Cesarean section; Breast biopsy (Right, 2008); bartholins cyst removal (2017); Abdominal hysterectomy; and Breast excisional biopsy (Right, 2010). Her family history includes Breast cancer in her maternal grandmother and mother; Hypothyroidism in her father; Multiple sclerosis in her cousin, maternal aunt, and maternal aunt. She  reports that she quit smoking about 5 years ago. She has a 15.00 pack-year smoking history. She has quit using smokeless tobacco. She reports that she does not currently use alcohol. She reports that she does not use drugs. She has a current medication list which includes the following prescription(s): cholecalciferol, estradiol, and metronidazole. She is allergic to morphine.       Review of Systems:  Review of Systems  Constitutional: Denied constitutional symptoms, night sweats, recent illness, fatigue, fever, insomnia and weight loss.  Eyes: Denied eye symptoms, eye pain, photophobia, vision change and visual disturbance.  Ears/Nose/Throat/Neck: Denied ear, nose, throat or neck symptoms, hearing loss, nasal discharge, sinus congestion and sore throat.   Cardiovascular: Denied cardiovascular symptoms, arrhythmia, chest pain/pressure, edema, exercise intolerance, orthopnea and palpitations.  Respiratory: Denied pulmonary symptoms, asthma, pleuritic pain, productive sputum, cough, dyspnea and wheezing.  Gastrointestinal: Denied, gastro-esophageal reflux, melena, nausea and vomiting.  Genitourinary: Denied genitourinary symptoms including symptomatic vaginal discharge, pelvic relaxation issues, and urinary complaints.  Musculoskeletal: Denied musculoskeletal symptoms, stiffness, swelling, muscle weakness and myalgia.  Dermatologic: Denied dermatology symptoms, rash and scar.  Neurologic: Denied neurology symptoms, dizziness, headache, neck pain and syncope.  Psychiatric: Denied psychiatric symptoms, anxiety and depression.  Endocrine: See HPI for additional information.   Meds:   Current Outpatient Medications on File Prior to Visit  Medication Sig Dispense Refill   cholecalciferol (VITAMIN D3) 25 MCG (1000 UT) tablet Take 2 tablets (2,000 Units total) by mouth daily. 60 tablet 3   metroNIDAZOLE (FLAGYL) 500 MG tablet Take 1 tablet (500 mg total) by mouth 2 (two) times daily. 14 tablet 0   No current facility-administered medications on file prior to visit.      Objective:     Vitals:   07/05/21 1548  BP: 128/78  Pulse: 72   Filed Weights   07/05/21 1548  Weight: 205 lb 4.8 oz (93.1 kg)                        Assessment:    No obstetric history on file. Patient Active Problem List   Diagnosis Date Noted   Iron deficiency anemia 12/19/2017   Anxiety and depression 07/26/2016     1. Increased follicle stimulating hormone [facility]) level   2. Symptomatic menopausal or female climacteric states  Plan:            1.  HRT I have discussed HRT with the patient in detail.  The risk/benefits of it were reviewed.  She understands that during menopause Estrogen decreases dramatically and that this results in an  increased risk of cardiovascular disease as well as osteoporosis.  We have also discussed the fact that hot flashes often result from a decrease in Estrogen, and that by replacing Estrogen, they can often be alleviated.  We have discussed skin, vaginal and urinary tract changes that may also take place from this drop in Estrogen.  Emotional changes have also been linked to Estrogen and we have briefly discussed this.  The benefits of HRT including decrease in hot flashes, vaginal dryness, and osteoporosis were discussed.  The emotional benefit and a possible change in her cardiovascular risk profile was also reviewed.  The risks associated with Hormone Replacement Therapy were also reviewed.  The use of unopposed Estrogen and its relationship to endometrial cancer was discussed.  The addition of Progesterone and its beneficial effect on endometrial cancer was also noted.  The fact that there has been no consistent definitive studies showing an increase in breast cancer in women who use HRT was discussed with the patient.  The possible side effects including breast tenderness, fluid retention, mood changes and vaginal bleeding were discussed.  The patient was informed that this is an elective medication and that she may choose not to take Hormone Replacement Therapy.  Literature on HRT was made available, and I believe that after answering all of the patient's questions.  Special emphasis on the WHI study, as well as several studies since that pertaining to the risks and benefits of estrogen replacement therapy were compared.  The possible limitations of these studies were discussed including the age stratification of the WHI study.  The possible increased role of Progesterone in these studies was discussed in detail.  Different types of hormone formulation and methods of taking hormone replacement therapy were discussed. Literature on HRT was made available, and I believe that after answering all of the patient's  questions she has an adequate and informed understanding of the risks and benefits of HRT. She has decided to begin Estrace daily. Orders No orders of the defined types were placed in this encounter.    Meds ordered this encounter  Medications   estradiol (ESTRACE) 2 MG tablet    Sig: Take 1 tablet (2 mg total) by mouth daily.    Dispense:  90 tablet    Refill:  3      F/U  Return in about 3 months (around 10/05/2021).  Finis Bud, M.D. 07/05/2021 4:18 PM

## 2021-07-05 NOTE — Progress Notes (Signed)
Patient presents today to discuss recent lab results. She states menopausal symptoms of night sweats, weight gain, irritability, lack of concentration and hot flashes. Patient states taking an OTC supplement called "eliquil".  She states no other questions or concerns.

## 2021-10-05 ENCOUNTER — Ambulatory Visit (INDEPENDENT_AMBULATORY_CARE_PROVIDER_SITE_OTHER): Payer: 59 | Admitting: Obstetrics and Gynecology

## 2021-10-05 ENCOUNTER — Encounter: Payer: Self-pay | Admitting: Obstetrics and Gynecology

## 2021-10-05 VITALS — BP 144/95 | HR 64 | Ht 65.0 in | Wt 197.9 lb

## 2021-10-05 DIAGNOSIS — Z79890 Hormone replacement therapy (postmenopausal): Secondary | ICD-10-CM

## 2021-10-05 NOTE — Progress Notes (Signed)
HPI:      [name] is a 46 y.o. [phone] who LMP was No LMP recorded (lmp unknown). Patient has had a hysterectomy.  Subjective:   She presents today for follow-up of HRT.  She reports that her hot flashes are resolved.  She says that she is still gaining weight despite cutting out carbs.  She has random sharp pains in her right chest left chest and side wall of her chest.  These last approximately 1 to 2 seconds.  She says her breast tenderness is resolving.  She would like to continue ERT.    Hx: The following portions of the patient's history were reviewed and updated as appropriate:             She  has a past medical history of Anemia, Anxiety, Chronic headaches, Depression, and Quit smoking (06/29/2016). She does not have any pertinent problems on file. She  has a past surgical history that includes Gallbladder surgery (2012); Gastric bypass (2012); Vaginal hysterectomy (2006); Tubal ligation; Cesarean section; Breast biopsy (Right, 2008); bartholins cyst removal (2017); Abdominal hysterectomy; and Breast excisional biopsy (Right, 2010). Her family history includes Breast cancer in her maternal grandmother and mother; Hypothyroidism in her father; Multiple sclerosis in her cousin, maternal aunt, and maternal aunt. She  reports that she quit smoking about 5 years ago. Her smoking use included cigarettes. She has a 15.00 pack-year smoking history. She has quit using smokeless tobacco. She reports that she does not currently use alcohol. She reports current drug use. Drug: Marijuana. She has a current medication list which includes the following prescription(s): estradiol. She is allergic to morphine.       Review of Systems:  Review of Systems  Constitutional: Denied constitutional symptoms, night sweats, recent illness, fatigue, fever, insomnia and weight loss.  Eyes: Denied eye symptoms, eye pain, photophobia, vision change and visual disturbance.  Ears/Nose/Throat/Neck:  Denied ear, nose, throat or neck symptoms, hearing loss, nasal discharge, sinus congestion and sore throat.  Cardiovascular: Denied cardiovascular symptoms, arrhythmia, chest pain/pressure, edema, exercise intolerance, orthopnea and palpitations.  Respiratory: Denied pulmonary symptoms, asthma, pleuritic pain, productive sputum, cough, dyspnea and wheezing.  Gastrointestinal: Denied, gastro-esophageal reflux, melena, nausea and vomiting.  Genitourinary: Denied genitourinary symptoms including symptomatic vaginal discharge, pelvic relaxation issues, and urinary complaints.  Musculoskeletal: Denied musculoskeletal symptoms, stiffness, swelling, muscle weakness and myalgia.  Dermatologic: Denied dermatology symptoms, rash and scar.  Neurologic: Denied neurology symptoms, dizziness, headache, neck pain and syncope.  Psychiatric: Denied psychiatric symptoms, anxiety and depression.  Endocrine: Denied endocrine symptoms including hot flashes and night sweats.   Meds:   Current Outpatient Medications on File Prior to Visit  Medication Sig Dispense Refill   estradiol (ESTRACE) 2 MG tablet Take 1 tablet (2 mg total) by mouth daily. 90 tablet 3   No current facility-administered medications on file prior to visit.      Objective:     Vitals:   10/05/21 0849  BP: (!) 144/95  Pulse: 64   Filed Weights   10/05/21 0849  Weight: 197 lb 14.4 oz (89.8 kg)                        Assessment:    [id number] Patient Active Problem List   Diagnosis Date Noted   Iron deficiency anemia 12/19/2017   Anxiety and depression 07/26/2016     1. Hormone replacement therapy (HRT)     Patient would like to continue because it has  significantly changed her hot flashes.   Plan:            1.  Continue HRT  2.  Discussed menopause and its effect on metabolism.  Briefly discussed diet and calorie counting/exercise methods of weight loss. Orders No orders of the defined types were placed in this  encounter.   No orders of the defined types were placed in this encounter.     F/U  Return for Annual Physical. I spent 21 minutes involved in the care of this patient preparing to see the patient by obtaining and reviewing her medical history (including labs, imaging tests and prior procedures), documenting clinical information in the electronic health record (EHR), counseling and coordinating care plans, writing and sending prescriptions, ordering tests or procedures and in direct communicating with the patient and medical staff discussing pertinent items from her history and physical exam.  Elonda Husky, M.D. 10/05/2021 9:41 AM

## 2021-10-05 NOTE — Progress Notes (Signed)
Patient presents for a 3 month follow-up for HRT. She states over the past three month her hot flashes have improved but she states complaints of weight gain, chest pain and leg swelling. No other concerns at this time.

## 2022-05-10 ENCOUNTER — Ambulatory Visit
Admission: EM | Admit: 2022-05-10 | Discharge: 2022-05-10 | Disposition: A | Discharge status: Home / Self Care | Payer: BC Managed Care – PPO | Attending: Family Medicine | Admitting: Family Medicine

## 2022-05-10 ENCOUNTER — Encounter: Payer: Self-pay | Admitting: Oncology

## 2022-05-10 DIAGNOSIS — Z20818 Contact with and (suspected) exposure to other bacterial communicable diseases: Secondary | ICD-10-CM

## 2022-05-10 DIAGNOSIS — J069 Acute upper respiratory infection, unspecified: Secondary | ICD-10-CM | POA: Diagnosis not present

## 2022-05-10 LAB — GROUP A STREP BY PCR: Group A Strep by PCR: NOT DETECTED

## 2022-05-10 MED ORDER — ONDANSETRON 4 MG PO TBDP: 4.0000 mg | ORAL_TABLET | Freq: Three times a day (TID) | ORAL | 0 refills | Status: DC | PRN

## 2022-05-10 NOTE — ED Triage Notes (Signed)
Pt c/o fever,bodyaches & HA x1 day. States she was exposed to strep. Denies any pain when swallowing. Has not taken her temp. Has taken aspirin & dayquil around 1000.

## 2022-05-10 NOTE — ED Provider Notes (Signed)
[facility]    CSN: [id number] Arrival date & time: 05/10/22  1214      History   Chief Complaint Chief Complaint  Patient presents with   Fever   Generalized Body Aches   Headache    HPI Abigial Manne is a 47 y.o. female.   HPI   Kayron presents for tactile fever, chills, headache that started last night.  Notes that her partner just was diagnosed with strep throat this morning.  She wants to get a jump on her treatment.  Does not currently have a sore throat.  There is been nausea no vomiting, diarrhea, abdominal pain or painful swallowing. Has been taking DayQuil and aspirin. Last taken these meds this morning.   Past Medical History:  Diagnosis Date   Anemia    Anxiety    Chronic headaches    Depression    Quit smoking 06/29/2016    Patient Active Problem List   Diagnosis Date Noted   Iron deficiency anemia 12/19/2017   Anxiety and depression 07/26/2016    Past Surgical History:  Procedure Laterality Date   ABDOMINAL HYSTERECTOMY     partial   bartholins cyst removal  2017   BREAST BIOPSY Right 2008   CORE - NEG   BREAST EXCISIONAL BIOPSY Right 2010   NEG   CESAREAN SECTION     GALLBLADDER SURGERY  2012   GASTRIC BYPASS  2012   TUBAL LIGATION     VAGINAL HYSTERECTOMY  2006   ovaries remain    OB History     Gravida  2   Para  2   Term  2   Preterm      AB      Living  2      SAB      IAB      Ectopic      Multiple      Live Births  2            Home Medications    Prior to Admission medications   Medication Sig Start Date End Date Taking? Authorizing Provider  estradiol (ESTRACE) 2 MG tablet Take 1 tablet (2 mg total) by mouth daily. 07/05/21 07/05/22 Yes Linzie Collin, MD  ondansetron (ZOFRAN-ODT) 4 MG disintegrating tablet Take 1 tablet (4 mg total) by mouth every 8 (eight) hours as needed. 05/10/22  Yes Katha Cabal, DO    Family History Family History  Problem Relation Age of Onset    Breast cancer Mother        72'S   Breast cancer Maternal Grandmother        60'S   Hypothyroidism Father    Multiple sclerosis Maternal Aunt    Multiple sclerosis Maternal Aunt    Multiple sclerosis Cousin     Social History Social History   Tobacco Use   Smoking status: Former    Packs/day: 1.00    Years: 15.00    Additional pack years: 0.00    Total pack years: 15.00    Types: Cigarettes    Quit date: 2018    Years since quitting: 6.2   Smokeless tobacco: Former  Building services engineer Use: Some days   Substances: Nicotine  Substance Use Topics   Alcohol use: Not Currently    Alcohol/week: 0.0 standard drinks of alcohol   Drug use: Yes    Types: Marijuana     Allergies   Morphine   Review of Systems Review of Systems:  negative unless otherwise stated in HPI.      Physical Exam Triage Vital Signs ED Triage Vitals  Enc Vitals Group     BP 05/10/22 1242 133/81     Pulse Rate 05/10/22 1242 (!) 52     Resp 05/10/22 1242 16     Temp 05/10/22 1242 98 F (36.7 C)     Temp Source 05/10/22 1242 Oral     SpO2 05/10/22 1242 94 %     Weight 05/10/22 1245 185 lb (83.9 kg)     Height 05/10/22 1245 5\' 5"  (1.651 m)     Head Circumference --      Peak Flow --      Pain Score 05/10/22 1244 7     Pain Loc --      Pain Edu? --      Excl. in GC? --    No data found.  Updated Vital Signs BP 133/81 (BP Location: Left Arm)   Pulse (!) 52   Temp 98 F (36.7 C) (Oral)   Resp 16   Ht 5\' 5"  (1.651 m)   Wt 83.9 kg   LMP  (LMP Unknown)   SpO2 94%   BMI 30.79 kg/m   Visual Acuity Right Eye Distance:   Left Eye Distance:   Bilateral Distance:    Right Eye Near:   Left Eye Near:    Bilateral Near:     Physical Exam GEN:     alert, non-toxic appearing female in no distress   HENT:  mucus membranes moist, oropharyngeal without lesions, erythema, or exudate, no tonsillar hypertrophy, no nasal discharge, bilateral TM normal EYES:   pupils equal and reactive, no  scleral injection or discharge NECK:  normal ROM, no lymphadenopathy, no meningismus   RESP:  no increased work of breathing, clear to auscultation bilaterally CVS:   regular rate and rhythm Skin:   warm and dry, no rash on visible skin    UC Treatments / Results  Labs (all labs ordered are listed, but only abnormal results are displayed) Labs Reviewed  GROUP A STREP BY PCR    EKG   Radiology No results found.  Procedures Procedures (including critical care time)  Medications Ordered in UC Medications - No data to display  Initial Impression / Assessment and Plan / UC Course  I have reviewed the triage vital signs and the nursing notes.  Pertinent labs & imaging results that were available during my care of the patient were reviewed by me and considered in my medical decision making (see chart for details).       Pt is a 47 y.o. female who presents for 1 day of respiratory symptoms. Emery is afebrile here without recent antipyretics. Satting well on room air. Overall pt is ill but non-toxic appearing, well hydrated, without respiratory distress. Pulmonary exam is unremarkable.  Strep PCR is negative.  History most consistent with viral respiratory illness. Discussed symptomatic treatment.  Explained lack of efficacy of antibiotics in viral disease.  Typical duration of symptoms discussed.  Zofran prescribed for nausea.  Return and ED precautions given and voiced understanding. Discussed MDM, treatment plan and plan for follow-up with patient who agrees with plan.     Final Clinical Impressions(s) / UC Diagnoses   Final diagnoses:  Viral upper respiratory tract infection  Streptococcus exposure     Discharge Instructions      Your strep test is negative.  Stop by the pharmacy to pick up your antinausea medicine. You  can take Tylenol and/or Ibuprofen as needed for fever reduction and pain relief.    For cough: honey 1/2 to 1 teaspoon (you can dilute the honey in  water or another fluid).  You can also use guaifenesin and dextromethorphan for cough. You can use a humidifier for chest congestion and cough.  If you don't have a humidifier, you can sit in the bathroom with the hot shower running.      For sore throat: try warm salt water gargles, Mucinex sore throat cough drops or cepacol lozenges, throat spray, warm tea or water with lemon/honey, popsicles or ice, or OTC cold relief medicine for throat discomfort. You can also purchase chloraseptic spray at the pharmacy or dollar store.   For congestion: take a daily anti-histamine like Zyrtec, Claritin, and a oral decongestant, such as pseudoephedrine.  You can also use Flonase 1-2 sprays in each nostril daily. Afrin is also a good option, if you do not have high blood pressure.    It is important to stay hydrated: drink plenty of fluids (water, gatorade/powerade/pedialyte, juices, or teas) to keep your throat moisturized and help further relieve irritation/discomfort.    Return or go to the Emergency Department if symptoms worsen or do not improve in the next few days      ED Prescriptions     Medication Sig Dispense Auth. Provider   ondansetron (ZOFRAN-ODT) 4 MG disintegrating tablet Take 1 tablet (4 mg total) by mouth every 8 (eight) hours as needed. 20 tablet Katha CabalBrimage, Eleri Ruben, DO      PDMP not reviewed this encounter.   Katha CabalBrimage, Alyse Kathan, DO 05/10/22 1559

## 2022-05-10 NOTE — Discharge Instructions (Addendum)
Your strep test is negative.  Stop by the pharmacy to pick up your antinausea medicine. You can take Tylenol and/or Ibuprofen as needed for fever reduction and pain relief.    For cough: honey 1/2 to 1 teaspoon (you can dilute the honey in water or another fluid).  You can also use guaifenesin and dextromethorphan for cough. You can use a humidifier for chest congestion and cough.  If you don't have a humidifier, you can sit in the bathroom with the hot shower running.      For sore throat: try warm salt water gargles, Mucinex sore throat cough drops or cepacol lozenges, throat spray, warm tea or water with lemon/honey, popsicles or ice, or OTC cold relief medicine for throat discomfort. You can also purchase chloraseptic spray at the pharmacy or dollar store.   For congestion: take a daily anti-histamine like Zyrtec, Claritin, and a oral decongestant, such as pseudoephedrine.  You can also use Flonase 1-2 sprays in each nostril daily. Afrin is also a good option, if you do not have high blood pressure.    It is important to stay hydrated: drink plenty of fluids (water, gatorade/powerade/pedialyte, juices, or teas) to keep your throat moisturized and help further relieve irritation/discomfort.    Return or go to the Emergency Department if symptoms worsen or do not improve in the next few days

## 2022-06-26 ENCOUNTER — Encounter: Payer: BC Managed Care – PPO | Admitting: Obstetrics and Gynecology

## 2022-06-26 DIAGNOSIS — Z01419 Encounter for gynecological examination (general) (routine) without abnormal findings: Secondary | ICD-10-CM

## 2022-06-26 DIAGNOSIS — Z1231 Encounter for screening mammogram for malignant neoplasm of breast: Secondary | ICD-10-CM

## 2022-07-25 ENCOUNTER — Other Ambulatory Visit: Payer: Self-pay | Admitting: Obstetrics and Gynecology

## 2022-07-25 DIAGNOSIS — N951 Menopausal and female climacteric states: Secondary | ICD-10-CM

## 2022-07-25 DIAGNOSIS — R7989 Other specified abnormal findings of blood chemistry: Secondary | ICD-10-CM

## 2022-07-30 DIAGNOSIS — Z1371 Encounter for nonprocreative screening for genetic disease carrier status: Secondary | ICD-10-CM

## 2022-07-30 HISTORY — DX: Encounter for nonprocreative screening for genetic disease carrier status: Z13.71

## 2022-08-16 NOTE — Progress Notes (Unsigned)
PCP:  Lorre Munroe, NP   No chief complaint on file.    HPI:      [name] is a 47 y.o. [phone] whose LMP was No LMP recorded (lmp unknown). Patient has had a hysterectomy., presents today for her annual examination.  Her menses are absent due to hyst  On HRT  Sex activity: {sex active: 315163}.  Last Pap: 07/26/16 Results were: no abnormalities /neg HPV DNA  Hx of STDs: {STD hx:14358}  Last mammogram: 06/29/21 Results were: normal--routine follow-up in 12 months There is no FH of breast cancer. There is no FH of ovarian cancer. The patient {does:18564} do self-breast exams.  Tobacco use: {tob:20664} Alcohol use: {Alcohol:11675} No drug use.  Exercise: {exercise:31265}  She {does:18564} get adequate calcium and Vitamin D in her diet.  Patient Active Problem List   Diagnosis Date Noted   Iron deficiency anemia 12/19/2017   Anxiety and depression 07/26/2016    Past Surgical History:  Procedure Laterality Date   ABDOMINAL HYSTERECTOMY     partial   bartholins cyst removal  2017   BREAST BIOPSY Right 2008   CORE - NEG   BREAST EXCISIONAL BIOPSY Right 2010   NEG   CESAREAN SECTION     GALLBLADDER SURGERY  2012   GASTRIC BYPASS  2012   TUBAL LIGATION     VAGINAL HYSTERECTOMY  2006   ovaries remain    Family History  Problem Relation Age of Onset   Breast cancer Mother        4'S   Breast cancer Maternal Grandmother        60'S   Hypothyroidism Father    Multiple sclerosis Maternal Aunt    Multiple sclerosis Maternal Aunt    Multiple sclerosis Cousin     Social History   Socioeconomic History   Marital status: Widowed    Spouse name: Not on file   Number of children: Not on file   Years of education: Not on file   Highest education level: Not on file  Occupational History   Not on file  Tobacco Use   Smoking status: Former    Current packs/day: 0.00    Average packs/day: 1 pack/day for 15.0 years (15.0 ttl pk-yrs)    Types:  Cigarettes    Start date: 2003    Quit date: 2018    Years since quitting: 6.5   Smokeless tobacco: Former  Building services engineer status: Some Days   Substances: Nicotine  Substance and Sexual Activity   Alcohol use: Not Currently    Alcohol/week: 0.0 standard drinks of alcohol   Drug use: Yes    Types: Marijuana   Sexual activity: Yes    Birth control/protection: Surgical    Comment: hysterectomy  Other Topics Concern   Not on file  Social History Narrative   Not on file   Social Determinants of Health   Financial Resource Strain: Low Risk  (12/05/2017)   Overall Financial Resource Strain (CARDIA)    Difficulty of Paying Living Expenses: Not very hard  Food Insecurity: Food Insecurity Present (12/05/2017)   Hunger Vital Sign    Worried About Running Out of Food in the Last Year: Sometimes true    Ran Out of Food in the Last Year: Sometimes true  Transportation Needs: No Transportation Needs (12/05/2017)   PRAPARE - Administrator, Civil Service (Medical): No    Lack of Transportation (Non-Medical): No  Physical Activity: Sufficiently  Active (12/05/2017)   Exercise Vital Sign    Days of Exercise per Week: 3 days    Minutes of Exercise per Session: 60 min  Stress: Not on file  Social Connections: Not on file  Intimate Partner Violence: Not At Risk (12/05/2017)   Humiliation, Afraid, Rape, and Kick questionnaire    Fear of Current or Ex-Partner: No    Emotionally Abused: No    Physically Abused: No    Sexually Abused: No     Current Outpatient Medications:    estradiol (ESTRACE) 2 MG tablet, TAKE 1 TABLET BY MOUTH EVERY DAY, Disp: 90 tablet, Rfl: 0   ondansetron (ZOFRAN-ODT) 4 MG disintegrating tablet, Take 1 tablet (4 mg total) by mouth every 8 (eight) hours as needed., Disp: 20 tablet, Rfl: 0     ROS:  Review of Systems BREAST: No symptoms   Objective: LMP  (LMP Unknown)    OBGyn Exam  Results: No results found for this or any previous visit  (from the past 24 hour(s)).  Assessment/Plan: No diagnosis found.  No orders of the defined types were placed in this encounter.            GYN counsel {counseling: 16159}     F/U  No follow-ups on file.  Dallis Darden B. Solveig Fangman, PA-C 08/16/2022 3:56 PM

## 2022-08-20 ENCOUNTER — Ambulatory Visit (INDEPENDENT_AMBULATORY_CARE_PROVIDER_SITE_OTHER): Payer: BC Managed Care – PPO | Admitting: Obstetrics and Gynecology

## 2022-08-20 ENCOUNTER — Other Ambulatory Visit (HOSPITAL_COMMUNITY)
Admission: RE | Admit: 2022-08-20 | Discharge: 2022-08-20 | Disposition: A | Discharge status: Home / Self Care | Payer: BC Managed Care – PPO | Source: Ambulatory Visit | Attending: Obstetrics and Gynecology | Admitting: Obstetrics and Gynecology

## 2022-08-20 ENCOUNTER — Encounter: Payer: Self-pay | Admitting: Obstetrics and Gynecology

## 2022-08-20 VITALS — BP 106/70 | Ht 65.0 in | Wt 193.0 lb

## 2022-08-20 DIAGNOSIS — A630 Anogenital (venereal) warts: Secondary | ICD-10-CM

## 2022-08-20 DIAGNOSIS — Z01411 Encounter for gynecological examination (general) (routine) with abnormal findings: Secondary | ICD-10-CM | POA: Diagnosis not present

## 2022-08-20 DIAGNOSIS — Z113 Encounter for screening for infections with a predominantly sexual mode of transmission: Secondary | ICD-10-CM | POA: Diagnosis not present

## 2022-08-20 DIAGNOSIS — Z803 Family history of malignant neoplasm of breast: Secondary | ICD-10-CM

## 2022-08-20 DIAGNOSIS — Z01419 Encounter for gynecological examination (general) (routine) without abnormal findings: Secondary | ICD-10-CM

## 2022-08-20 DIAGNOSIS — Z79890 Hormone replacement therapy (postmenopausal): Secondary | ICD-10-CM

## 2022-08-20 DIAGNOSIS — N951 Menopausal and female climacteric states: Secondary | ICD-10-CM

## 2022-08-20 DIAGNOSIS — Z1231 Encounter for screening mammogram for malignant neoplasm of breast: Secondary | ICD-10-CM

## 2022-08-20 MED ORDER — ESTRADIOL 2 MG PO TABS: 2.0000 mg | ORAL_TABLET | Freq: Every day | ORAL | 3 refills | Status: DC

## 2022-08-20 NOTE — Addendum Note (Signed)
Addended by: Althea Grimmer B on: 08/20/2022 03:30 PM   Modules accepted: Orders

## 2022-08-20 NOTE — Patient Instructions (Addendum)
I value your feedback and you entrusting us with your care. If you get a [facility] patient survey, I would appreciate you taking the time to let us know about your experience today. Thank you!  [facility] ([place])--[phone]  

## 2022-08-22 LAB — CERVICOVAGINAL ANCILLARY ONLY
Chlamydia: NEGATIVE
Comment: NEGATIVE
Comment: NORMAL
Neisseria Gonorrhea: NEGATIVE

## 2022-08-27 DIAGNOSIS — Z9189 Other specified personal risk factors, not elsewhere classified: Secondary | ICD-10-CM

## 2022-08-27 HISTORY — DX: Other specified personal risk factors, not elsewhere classified: Z91.89

## 2022-08-28 ENCOUNTER — Encounter: Payer: Self-pay | Admitting: Obstetrics and Gynecology

## 2022-09-11 ENCOUNTER — Encounter: Payer: Self-pay | Admitting: Obstetrics and Gynecology

## 2022-09-17 ENCOUNTER — Inpatient Hospital Stay: Admission: RE | Admit: 2022-09-17 | Payer: BC Managed Care – PPO | Source: Ambulatory Visit

## 2022-09-28 ENCOUNTER — Telehealth: Payer: Self-pay | Admitting: Obstetrics and Gynecology

## 2022-09-28 NOTE — Telephone Encounter (Signed)
Pt aware of neg MyRisk results. IBIS=16.5%/riskscore=21.1%. Pt aware of recommendations of monthly SBE, yearly CBE and mammos, as well as scr breast MRI. Will call for MRI ref prn. Had to reschedule mammo.   Patient understands these results only apply to her and her children, and this is not indicative of genetic testing results of her other family members. It is recommended that her other family members have genetic testing done.  Pt also understands negative genetic testing doesn't mean she will never get any of these cancers.   Hard copy mailed to pt. F/u prn.

## 2022-12-08 ENCOUNTER — Other Ambulatory Visit: Payer: Self-pay | Admitting: Obstetrics and Gynecology

## 2022-12-08 DIAGNOSIS — N951 Menopausal and female climacteric states: Secondary | ICD-10-CM

## 2022-12-08 DIAGNOSIS — Z79890 Hormone replacement therapy (postmenopausal): Secondary | ICD-10-CM

## 2023-08-22 ENCOUNTER — Other Ambulatory Visit: Payer: Self-pay | Admitting: Obstetrics and Gynecology

## 2023-08-22 DIAGNOSIS — N951 Menopausal and female climacteric states: Secondary | ICD-10-CM

## 2023-08-22 DIAGNOSIS — Z79890 Hormone replacement therapy (postmenopausal): Secondary | ICD-10-CM

## 2023-08-29 ENCOUNTER — Other Ambulatory Visit: Payer: Self-pay

## 2023-08-29 DIAGNOSIS — Z79890 Hormone replacement therapy (postmenopausal): Secondary | ICD-10-CM

## 2023-08-29 DIAGNOSIS — N951 Menopausal and female climacteric states: Secondary | ICD-10-CM

## 2023-08-29 MED ORDER — ESTRADIOL 2 MG PO TABS: 2.0000 mg | ORAL_TABLET | Freq: Every day | ORAL | 0 refills | Status: DC

## 2023-09-17 ENCOUNTER — Encounter: Payer: Self-pay | Admitting: Oncology

## 2023-09-20 ENCOUNTER — Other Ambulatory Visit: Payer: Self-pay | Admitting: Obstetrics and Gynecology

## 2023-09-20 DIAGNOSIS — N951 Menopausal and female climacteric states: Secondary | ICD-10-CM

## 2023-09-20 DIAGNOSIS — Z79890 Hormone replacement therapy (postmenopausal): Secondary | ICD-10-CM

## 2023-09-24 ENCOUNTER — Other Ambulatory Visit: Payer: Self-pay | Admitting: Obstetrics and Gynecology

## 2023-09-24 DIAGNOSIS — N951 Menopausal and female climacteric states: Secondary | ICD-10-CM

## 2023-09-24 DIAGNOSIS — Z79890 Hormone replacement therapy (postmenopausal): Secondary | ICD-10-CM

## 2023-09-25 DIAGNOSIS — Z9189 Other specified personal risk factors, not elsewhere classified: Secondary | ICD-10-CM | POA: Insufficient documentation

## 2023-09-25 NOTE — Progress Notes (Unsigned)
 PCP:  Antonette Angeline ORN, NP   No chief complaint on file.    HPI:      [name] is a 48 y.o. [phone] whose LMP was No LMP recorded (lmp unknown). Patient has had a hysterectomy., presents today for her annual examination.  Her menses are absent due to vag hyst 2006 for AUB/endometriosis. On estradiol  2 mg for vasomotor sx with mostly sx relief. Has mood changes and brain fog.   Sex activity: single partner, contraception - status post hysterectomy. No pain/bleeding. Does have dryness, improved with lubricants. Has noticed cluster of skin tags vaginally recently. New partner within past 6 months.  Last Pap: 07/26/16 Results were: no abnormalities /neg HPV DNA; no longer indicated  Last mammogram: 06/29/21 Results were: normal--routine follow-up in 12 months There is a FH of breast cancer in her mom and [facility]. There is no FH of ovarian cancer. Pt had neg MyRisk results 7/24. IBIS=16.5%/riskscore=21.1% The patient does do self-breast exams.  Tobacco use: vapes daily Alcohol use: none No drug use.  Exercise: not active  She does get adequate calcium but not Vitamin D  in her diet. Labs with PCP  Patient Active Problem List   Diagnosis Date Noted   Family history of breast cancer 12/11/2019   Iron deficiency anemia 12/19/2017   Anxiety and depression 07/26/2016    Past Surgical History:  Procedure Laterality Date   ABDOMINAL HYSTERECTOMY     partial   bartholins cyst removal  2017   BREAST BIOPSY Right 2008   CORE - NEG   BREAST EXCISIONAL BIOPSY Right 2010   NEG   CESAREAN SECTION     GALLBLADDER SURGERY  2012   GASTRIC BYPASS  2012   TUBAL LIGATION     VAGINAL HYSTERECTOMY  2006   ovaries remain    Family History  Problem Relation Age of Onset   Breast cancer Mother        61'S   Hypothyroidism Father    Multiple sclerosis Maternal Aunt    Multiple sclerosis Maternal Aunt    Breast cancer Maternal Grandmother        60'S   Multiple sclerosis Cousin     Breast cancer Maternal Great-grandmother        unknown age    Social History   Socioeconomic History   Marital status: Widowed    Spouse name: Not on file   Number of children: Not on file   Years of education: Not on file   Highest education level: Not on file  Occupational History   Not on file  Tobacco Use   Smoking status: Former    Current packs/day: 0.00    Average packs/day: 1 pack/day for 15.0 years (15.0 ttl pk-yrs)    Types: Cigarettes    Start date: 2003    Quit date: 2018    Years since quitting: 7.6   Smokeless tobacco: Former  Building services engineer status: Some Days   Substances: Nicotine  Substance and Sexual Activity   Alcohol use: Not Currently    Alcohol/week: 0.0 standard drinks of alcohol   Drug use: Yes    Types: Marijuana   Sexual activity: Yes    Birth control/protection: Surgical    Comment: hysterectomy  Other Topics Concern   Not on file  Social History Narrative   Not on file   Social Drivers of Health   Financial Resource Strain: Low Risk  (12/05/2017)   Overall Financial Resource Strain (CARDIA)  Difficulty of Paying Living Expenses: Not very hard  Food Insecurity: Food Insecurity Present (12/05/2017)   Hunger Vital Sign    Worried About Running Out of Food in the Last Year: Sometimes true    Ran Out of Food in the Last Year: Sometimes true  Transportation Needs: No Transportation Needs (12/05/2017)   PRAPARE - Administrator, Civil Service (Medical): No    Lack of Transportation (Non-Medical): No  Physical Activity: Sufficiently Active (12/05/2017)   Exercise Vital Sign    Days of Exercise per Week: 3 days    Minutes of Exercise per Session: 60 min  Stress: Not on file  Social Connections: Not on file  Intimate Partner Violence: Not At Risk (12/05/2017)   Humiliation, Afraid, Rape, and Kick questionnaire    Fear of Current or Ex-Partner: No    Emotionally Abused: No    Physically Abused: No    Sexually Abused: No      Current Outpatient Medications:    estradiol  (ESTRACE ) 2 MG tablet, Take 1 tablet (2 mg total) by mouth daily., Disp: 30 tablet, Rfl: 0     ROS:  Review of Systems  Constitutional:  Positive for fatigue. Negative for fever and unexpected weight change.  Respiratory:  Negative for cough, shortness of breath and wheezing.   Cardiovascular:  Negative for chest pain, palpitations and leg swelling.  Gastrointestinal:  Negative for blood in stool, constipation, diarrhea, nausea and vomiting.  Endocrine: Negative for cold intolerance, heat intolerance and polyuria.  Genitourinary:  Negative for dyspareunia, dysuria, flank pain, frequency, genital sores, hematuria, menstrual problem, pelvic pain, urgency, vaginal bleeding, vaginal discharge and vaginal pain.  Musculoskeletal:  Negative for back pain, joint swelling and myalgias.  Skin:  Negative for rash.  Neurological:  Positive for headaches. Negative for dizziness, syncope, light-headedness and numbness.  Hematological:  Negative for adenopathy.  Psychiatric/Behavioral:  Positive for agitation and dysphoric mood. Negative for confusion, sleep disturbance and suicidal ideas. The patient is not nervous/anxious.    BREAST: No symptoms   Objective: LMP  (LMP Unknown)    Physical Exam Constitutional:      Appearance: She is well-developed.  Genitourinary:     Vulva normal.     Genitourinary Comments: UTERUS/CX SURG REM     Right Labia: lesions.     Right Labia: No rash or tenderness.    Left Labia: lesions.     Left Labia: No tenderness or rash.    Vaginal cuff intact.    No vaginal discharge, erythema or tenderness.      Right Adnexa: not tender and no mass present.    Left Adnexa: not tender and no mass present.    Cervix is absent.     Uterus is absent.  Breasts:    Right: No mass, nipple discharge, skin change or tenderness.     Left: No mass, nipple discharge, skin change or tenderness.  Neck:     Thyroid: No  thyromegaly.  Cardiovascular:     Rate and Rhythm: Normal rate and regular rhythm.     Heart sounds: Normal heart sounds. No murmur heard. Pulmonary:     Effort: Pulmonary effort is normal.     Breath sounds: Normal breath sounds.  Abdominal:     Palpations: Abdomen is soft.     Tenderness: There is no abdominal tenderness. There is no guarding.  Musculoskeletal:        General: Normal range of motion.     Cervical back: Normal range  of motion.  Neurological:     General: No focal deficit present.     Mental Status: She is alert and oriented to person, place, and time.     Cranial Nerves: No cranial nerve deficit.  Skin:    General: Skin is warm and dry.  Psychiatric:        Mood and Affect: Mood normal.        Behavior: Behavior normal.        Thought Content: Thought content normal.        Judgment: Judgment normal.  Vitals reviewed.     Assessment/Plan: Encounter for annual routine gynecological examination  Screening for STD (sexually transmitted disease) - Plan: Cervicovaginal ancillary only  Encounter for screening mammogram for malignant neoplasm of breast - Plan: MM 3D SCREENING MAMMOGRAM BILATERAL BREAST; pt to schedule mammo  Family history of breast cancer - Plan: MM 3D SCREENING MAMMOGRAM BILATERAL BREAST, [facility] ([facility]); [facility] testing discussed and done today. Handout given. Will f/u with results.   Hormone replacement therapy (HRT) - Plan: estradiol  (ESTRACE ) 2 MG tablet; Rx RF.   Vasomotor symptoms due to menopause - Plan: estradiol  (ESTRACE ) 2 MG tablet; Rx RF. Mostly controlled. Would add Rx prog if needed; on sufficient ERT dose. Add exercise/B complex  Genital warts--treated with TCA. Tolerated well. Wash in 4-6 hrs. Wound care. F/u in 1 mo if still sx for retreatment.    No orders of the defined types were placed in this encounter.            GYN counsel breast self exam, mammography screening, menopause,  adequate intake of calcium and vitamin D , diet and exercise     F/U  No follow-ups on file.  Aaro Meyers B. Jennika Ringgold, PA-C 09/25/2023 2:43 PM

## 2023-09-26 ENCOUNTER — Ambulatory Visit (INDEPENDENT_AMBULATORY_CARE_PROVIDER_SITE_OTHER): Payer: Self-pay | Admitting: Obstetrics and Gynecology

## 2023-09-26 ENCOUNTER — Encounter: Payer: Self-pay | Admitting: Obstetrics and Gynecology

## 2023-09-26 ENCOUNTER — Encounter: Payer: Self-pay | Admitting: Oncology

## 2023-09-26 VITALS — BP 125/77 | HR 57 | Ht 65.0 in | Wt 200.0 lb

## 2023-09-26 DIAGNOSIS — Z1329 Encounter for screening for other suspected endocrine disorder: Secondary | ICD-10-CM

## 2023-09-26 DIAGNOSIS — R635 Abnormal weight gain: Secondary | ICD-10-CM

## 2023-09-26 DIAGNOSIS — N951 Menopausal and female climacteric states: Secondary | ICD-10-CM | POA: Diagnosis not present

## 2023-09-26 DIAGNOSIS — Z1231 Encounter for screening mammogram for malignant neoplasm of breast: Secondary | ICD-10-CM

## 2023-09-26 DIAGNOSIS — N644 Mastodynia: Secondary | ICD-10-CM

## 2023-09-26 DIAGNOSIS — Z803 Family history of malignant neoplasm of breast: Secondary | ICD-10-CM

## 2023-09-26 DIAGNOSIS — Z13220 Encounter for screening for lipoid disorders: Secondary | ICD-10-CM

## 2023-09-26 DIAGNOSIS — Z01411 Encounter for gynecological examination (general) (routine) with abnormal findings: Secondary | ICD-10-CM | POA: Diagnosis not present

## 2023-09-26 DIAGNOSIS — Z01419 Encounter for gynecological examination (general) (routine) without abnormal findings: Secondary | ICD-10-CM

## 2023-09-26 DIAGNOSIS — Z0000 Encounter for general adult medical examination without abnormal findings: Secondary | ICD-10-CM

## 2023-09-26 DIAGNOSIS — Z90710 Acquired absence of both cervix and uterus: Secondary | ICD-10-CM

## 2023-09-26 DIAGNOSIS — Z79890 Hormone replacement therapy (postmenopausal): Secondary | ICD-10-CM

## 2023-09-26 DIAGNOSIS — Z131 Encounter for screening for diabetes mellitus: Secondary | ICD-10-CM

## 2023-09-26 DIAGNOSIS — Z9189 Other specified personal risk factors, not elsewhere classified: Secondary | ICD-10-CM

## 2023-09-26 MED ORDER — PROGESTERONE MICRONIZED 100 MG PO CAPS
100.0000 mg | ORAL_CAPSULE | Freq: Every evening | ORAL | 0 refills | Status: AC
Start: 2023-09-26 — End: ?

## 2023-09-26 MED ORDER — PROGESTERONE 200 MG PO CAPS: 200.0000 mg | ORAL_CAPSULE | Freq: Every evening | ORAL | 0 refills | Status: AC

## 2023-09-26 MED ORDER — ESTRADIOL 2 MG PO TABS
2.0000 mg | ORAL_TABLET | Freq: Every day | ORAL | 3 refills | Status: AC
Start: 2023-09-26 — End: ?

## 2023-09-26 NOTE — Patient Instructions (Addendum)
 I value your feedback and you entrusting [place] with your care. If you get a [facility] patient survey, I would appreciate you taking the time to let us know about your experience today. Thank you!  [facility] ([place])--[phone]

## 2023-09-27 LAB — COMPREHENSIVE METABOLIC PANEL WITH GFR
ALT: 10 IU/L (ref 0–32)
AST: 17 IU/L (ref 0–40)
Albumin: 4.2 g/dL (ref 3.9–4.9)
Alkaline Phosphatase: 59 IU/L (ref 44–121)
BUN/Creatinine Ratio: 8 — ABNORMAL LOW (ref 9–23)
BUN: 5 mg/dL — ABNORMAL LOW (ref 6–24)
Bilirubin Total: 0.3 mg/dL (ref 0.0–1.2)
CO2: 25 mmol/L (ref 20–29)
Calcium: 9.3 mg/dL (ref 8.7–10.2)
Chloride: 100 mmol/L (ref 96–106)
Creatinine, Ser: 0.66 mg/dL (ref 0.57–1.00)
Globulin, Total: 2.3 g/dL (ref 1.5–4.5)
Glucose: 98 mg/dL (ref 70–99)
Potassium: 4.5 mmol/L (ref 3.5–5.2)
Sodium: 138 mmol/L (ref 134–144)
Total Protein: 6.5 g/dL (ref 6.0–8.5)
eGFR: 110 mL/min/1.73 (ref >59)

## 2023-09-27 LAB — CBC WITH DIFFERENTIAL/PLATELET
Basophils Absolute: 0 x10E3/uL (ref 0.0–0.2)
Basos: 1 %
EOS (ABSOLUTE): 0.1 x10E3/uL (ref 0.0–0.4)
Eos: 1 %
Hematocrit: 38.6 % (ref 34.0–46.6)
Hemoglobin: 12.6 g/dL (ref 11.1–15.9)
Immature Grans (Abs): 0 x10E3/uL (ref 0.0–0.1)
Immature Granulocytes: 0 %
Lymphocytes Absolute: 2.2 x10E3/uL (ref 0.7–3.1)
Lymphs: 39 %
MCH: 29.3 pg (ref 26.6–33.0)
MCHC: 32.6 g/dL (ref 31.5–35.7)
MCV: 90 fL (ref 79–97)
Monocytes Absolute: 0.5 x10E3/uL (ref 0.1–0.9)
Monocytes: 9 %
Neutrophils Absolute: 2.8 x10E3/uL (ref 1.4–7.0)
Neutrophils: 50 %
Platelets: 182 x10E3/uL (ref 150–450)
RBC: 4.3 x10E6/uL (ref 3.77–5.28)
RDW: 13.6 % (ref 11.7–15.4)
WBC: 5.7 x10E3/uL (ref 3.4–10.8)

## 2023-09-27 LAB — HEMOGLOBIN A1C
Est. average glucose Bld gHb Est-mCnc: 117 mg/dL
Hgb A1c MFr Bld: 5.7 % — ABNORMAL HIGH (ref 4.8–5.6)

## 2023-09-27 LAB — LIPID PANEL
Chol/HDL Ratio: 1.7 ratio (ref 0.0–4.4)
Cholesterol, Total: 198 mg/dL (ref 100–199)
HDL: 119 mg/dL (ref >39)
LDL Chol Calc (NIH): 67 mg/dL (ref 0–99)
Triglycerides: 64 mg/dL (ref 0–149)
VLDL Cholesterol Cal: 12 mg/dL (ref 5–40)

## 2023-09-27 LAB — T4, FREE: Free T4: 1.33 ng/dL (ref 0.82–1.77)

## 2023-09-27 LAB — TSH: TSH: 0.591 u[IU]/mL (ref 0.450–4.500)

## 2023-10-01 ENCOUNTER — Ambulatory Visit: Payer: Self-pay | Admitting: Obstetrics and Gynecology

## 2023-10-02 ENCOUNTER — Ambulatory Visit
Admission: RE | Admit: 2023-10-02 | Discharge: 2023-10-02 | Disposition: A | Discharge status: Home / Self Care | Source: Ambulatory Visit | Attending: Obstetrics and Gynecology | Admitting: Obstetrics and Gynecology

## 2023-10-02 DIAGNOSIS — Z803 Family history of malignant neoplasm of breast: Secondary | ICD-10-CM

## 2023-10-02 DIAGNOSIS — N644 Mastodynia: Secondary | ICD-10-CM | POA: Diagnosis present

## 2023-10-02 DIAGNOSIS — Z9189 Other specified personal risk factors, not elsewhere classified: Secondary | ICD-10-CM

## 2023-10-02 DIAGNOSIS — Z1231 Encounter for screening mammogram for malignant neoplasm of breast: Secondary | ICD-10-CM

## 2023-10-18 ENCOUNTER — Other Ambulatory Visit: Payer: Self-pay | Admitting: Obstetrics and Gynecology

## 2023-10-18 DIAGNOSIS — N951 Menopausal and female climacteric states: Secondary | ICD-10-CM

## 2023-10-18 DIAGNOSIS — Z79890 Hormone replacement therapy (postmenopausal): Secondary | ICD-10-CM

## 2023-12-17 ENCOUNTER — Other Ambulatory Visit: Payer: Self-pay | Admitting: Obstetrics and Gynecology

## 2023-12-17 DIAGNOSIS — N951 Menopausal and female climacteric states: Secondary | ICD-10-CM

## 2023-12-17 DIAGNOSIS — Z79890 Hormone replacement therapy (postmenopausal): Secondary | ICD-10-CM

## 2023-12-17 NOTE — Telephone Encounter (Signed)
 Called patient, no answer, LVMTRC.

## 2023-12-17 NOTE — Telephone Encounter (Signed)
 Pls call pt to see how she is doing with menopausal sx and prog 200 mg dose before I refill. Thx

## 2023-12-23 NOTE — Telephone Encounter (Signed)
 Called patient, no answer, LVMTRC.
